# Patient Record
Sex: Male | Born: 1990 | Race: White | Hispanic: No | Marital: Married | Smoking: Current every day smoker
Health system: Southern US, Community
[De-identification: ages and names within clinical notes are randomized; demographics above are authoritative.]

## PROBLEM LIST (undated history)

## (undated) DIAGNOSIS — F329 Major depressive disorder, single episode, unspecified: Secondary | ICD-10-CM

## (undated) DIAGNOSIS — F32A Depression, unspecified: Secondary | ICD-10-CM

## (undated) DIAGNOSIS — T7840XA Allergy, unspecified, initial encounter: Secondary | ICD-10-CM

## (undated) DIAGNOSIS — F419 Anxiety disorder, unspecified: Secondary | ICD-10-CM

## (undated) HISTORY — DX: Anxiety disorder, unspecified: F41.9

## (undated) HISTORY — DX: Depression, unspecified: F32.A

## (undated) HISTORY — PX: TONSILLECTOMY: SUR1361

## (undated) HISTORY — DX: Allergy, unspecified, initial encounter: T78.40XA

---
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
## 2011-02-27 ENCOUNTER — Emergency Department (HOSPITAL_COMMUNITY)
Admission: EM | Admit: 2011-02-27 | Discharge: 2011-02-27 | Disposition: A | Discharge status: Home / Self Care | Payer: BC Managed Care – PPO | Attending: Emergency Medicine | Admitting: Emergency Medicine

## 2011-02-27 ENCOUNTER — Emergency Department (HOSPITAL_COMMUNITY): Payer: BC Managed Care – PPO

## 2011-02-27 DIAGNOSIS — S0230XA Fracture of orbital floor, unspecified side, initial encounter for closed fracture: Secondary | ICD-10-CM | POA: Insufficient documentation

## 2011-02-27 DIAGNOSIS — R1110 Vomiting, unspecified: Secondary | ICD-10-CM | POA: Insufficient documentation

## 2011-02-27 DIAGNOSIS — F17200 Nicotine dependence, unspecified, uncomplicated: Secondary | ICD-10-CM | POA: Insufficient documentation

## 2011-02-27 DIAGNOSIS — S0231XA Fracture of orbital floor, right side, initial encounter for closed fracture: Secondary | ICD-10-CM

## 2011-02-27 MED ORDER — CLINDAMYCIN HCL 150 MG PO CAPS
300.0000 mg | ORAL_CAPSULE | Freq: Once | ORAL | Status: AC
  Administered 2011-02-27: 300 mg via ORAL
  Filled 2011-02-27: qty 2

## 2011-02-27 MED ORDER — HYDROCODONE-ACETAMINOPHEN 5-500 MG PO TABS: 1.0000 | ORAL_TABLET | Freq: Four times a day (QID) | ORAL | Status: DC | PRN

## 2011-02-27 MED ORDER — CLINDAMYCIN HCL 150 MG PO CAPS: 300.0000 mg | ORAL_CAPSULE | Freq: Two times a day (BID) | ORAL | Status: AC

## 2011-02-27 MED ORDER — HYDROCODONE-ACETAMINOPHEN 5-500 MG PO TABS: 1.0000 | ORAL_TABLET | Freq: Four times a day (QID) | ORAL | Status: AC | PRN

## 2011-02-27 MED ORDER — IBUPROFEN 800 MG PO TABS: 800.0000 mg | ORAL_TABLET | Freq: Three times a day (TID) | ORAL | Status: DC

## 2011-02-27 MED ORDER — HYDROCODONE-ACETAMINOPHEN 5-325 MG PO TABS
2.0000 | ORAL_TABLET | Freq: Once | ORAL | Status: AC
  Administered 2011-02-27: 2 via ORAL
  Filled 2011-02-27: qty 2

## 2011-02-27 MED ORDER — IBUPROFEN 800 MG PO TABS
800.0000 mg | ORAL_TABLET | Freq: Once | ORAL | Status: AC
  Administered 2011-02-27: 800 mg via ORAL
  Filled 2011-02-27: qty 1

## 2011-02-27 MED ORDER — CLINDAMYCIN HCL 150 MG PO CAPS: 300.0000 mg | ORAL_CAPSULE | Freq: Two times a day (BID) | ORAL | Status: DC

## 2011-02-27 MED ORDER — IBUPROFEN 800 MG PO TABS: 800.0000 mg | ORAL_TABLET | Freq: Three times a day (TID) | ORAL | Status: AC
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
## 2011-02-27 NOTE — ED Notes (Signed)
Pt vomited green fld, says there was some blood in it , but not seen by me
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
## 2011-02-27 NOTE — ED Notes (Signed)
Ice pack applied.

## 2011-02-27 NOTE — ED Notes (Signed)
Pt out to desk, encouraged to stay for eval and tx

## 2011-02-27 NOTE — ED Notes (Signed)
Pt resting. Alert/oriented. Sitting up in bed. NAD.EDP in with pt and aware awaiting d/c papers.

## 2011-02-27 NOTE — Progress Notes (Signed)
0454 Spoke with Dr. Jearld Fenton, ENT. He advised patient could follow up in his office. Patient to call the office. Advised patient of consult and plan for follow up.
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
## 2012-09-29 ENCOUNTER — Encounter (HOSPITAL_COMMUNITY): Payer: Self-pay | Admitting: *Deleted

## 2012-09-29 ENCOUNTER — Emergency Department (HOSPITAL_COMMUNITY)
Admission: EM | Admit: 2012-09-29 | Discharge: 2012-09-29 | Disposition: A | Discharge status: Home / Self Care | Payer: BC Managed Care – PPO | Attending: Emergency Medicine | Admitting: Emergency Medicine

## 2012-09-29 DIAGNOSIS — R197 Diarrhea, unspecified: Secondary | ICD-10-CM | POA: Insufficient documentation

## 2012-09-29 DIAGNOSIS — IMO0001 Reserved for inherently not codable concepts without codable children: Secondary | ICD-10-CM | POA: Insufficient documentation

## 2012-09-29 DIAGNOSIS — R509 Fever, unspecified: Secondary | ICD-10-CM | POA: Insufficient documentation

## 2012-09-29 DIAGNOSIS — F17200 Nicotine dependence, unspecified, uncomplicated: Secondary | ICD-10-CM | POA: Insufficient documentation

## 2012-09-29 DIAGNOSIS — R109 Unspecified abdominal pain: Secondary | ICD-10-CM | POA: Insufficient documentation

## 2012-09-29 DIAGNOSIS — R51 Headache: Secondary | ICD-10-CM | POA: Insufficient documentation

## 2012-09-29 DIAGNOSIS — R112 Nausea with vomiting, unspecified: Secondary | ICD-10-CM | POA: Insufficient documentation

## 2012-09-29 LAB — CBC WITH DIFFERENTIAL/PLATELET
Basophils Absolute: 0 10*3/uL (ref 0.0–0.1)
Basophils Relative: 0 % (ref 0–1)
Eosinophils Absolute: 0 10*3/uL (ref 0.0–0.7)
MCH: 30.7 pg (ref 26.0–34.0)
MCHC: 36.4 g/dL — ABNORMAL HIGH (ref 30.0–36.0)
Neutrophils Relative %: 92 % — ABNORMAL HIGH (ref 43–77)
Platelets: 236 10*3/uL (ref 150–400)
RDW: 12.7 % (ref 11.5–15.5)

## 2012-09-29 LAB — COMPREHENSIVE METABOLIC PANEL
ALT: 30 U/L (ref 0–53)
AST: 19 U/L (ref 0–37)
Albumin: 5 g/dL (ref 3.5–5.2)
Alkaline Phosphatase: 110 U/L (ref 39–117)
BUN: 18 mg/dL (ref 6–23)
Potassium: 4 mEq/L (ref 3.5–5.1)
Sodium: 141 mEq/L (ref 135–145)
Total Protein: 8 g/dL (ref 6.0–8.3)

## 2012-09-29 MED ORDER — ONDANSETRON 4 MG PO TBDP
8.0000 mg | ORAL_TABLET | Freq: Once | ORAL | Status: AC
  Administered 2012-09-29: 8 mg via ORAL

## 2012-09-29 MED ORDER — SODIUM CHLORIDE 0.9 % IV BOLUS (SEPSIS)
1000.0000 mL | Freq: Once | INTRAVENOUS | Status: AC
  Administered 2012-09-29: 1000 mL via INTRAVENOUS

## 2012-09-29 MED ORDER — ONDANSETRON 4 MG PO TBDP
ORAL_TABLET | ORAL | Status: AC
  Filled 2012-09-29: qty 2
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
## 2012-09-29 NOTE — ED Notes (Signed)
Second 1L bolus of NS started per Dondra Spry NP verbal order

## 2012-09-29 NOTE — ED Notes (Signed)
Pt with acute onset emesis, diarrhea and lower abd pain that radiates to bil flank while at work this am.  Keith Ross was sick with same s/s earlier, but s/s were less.

## 2012-09-30 NOTE — ED Provider Notes (Signed)
Medical screening examination/treatment/procedure(s) were performed by non-physician practitioner and as supervising physician I was immediately available for consultation/collaboration.   Adynn Caseres H Jakeim Sedore, MD 09/30/12 2144 

## 2015-06-30 ENCOUNTER — Ambulatory Visit (INDEPENDENT_AMBULATORY_CARE_PROVIDER_SITE_OTHER): Payer: Worker's Compensation

## 2015-06-30 ENCOUNTER — Ambulatory Visit
Admission: EM | Admit: 2015-06-30 | Discharge: 2015-06-30 | Disposition: A | Discharge status: Home / Self Care | Payer: Worker's Compensation | Attending: Family Medicine | Admitting: Family Medicine

## 2015-06-30 ENCOUNTER — Encounter: Payer: Self-pay | Admitting: Emergency Medicine

## 2015-06-30 DIAGNOSIS — T148 Other injury of unspecified body region: Secondary | ICD-10-CM | POA: Diagnosis not present

## 2015-06-30 DIAGNOSIS — S62639B Displaced fracture of distal phalanx of unspecified finger, initial encounter for open fracture: Secondary | ICD-10-CM | POA: Diagnosis not present

## 2015-06-30 DIAGNOSIS — T148XXA Other injury of unspecified body region, initial encounter: Secondary | ICD-10-CM

## 2015-06-30 MED ORDER — IBUPROFEN 800 MG PO TABS
800.0000 mg | ORAL_TABLET | Freq: Once | ORAL | Status: AC
  Administered 2015-06-30: 800 mg via ORAL

## 2015-06-30 MED ORDER — SULFAMETHOXAZOLE-TRIMETHOPRIM 800-160 MG PO TABS: 1.0000 | ORAL_TABLET | Freq: Two times a day (BID) | ORAL | Status: DC

## 2015-06-30 MED ORDER — IBUPROFEN 800 MG PO TABS: 800.0000 mg | ORAL_TABLET | Freq: Once | ORAL | Status: DC

## 2015-06-30 MED ORDER — TETANUS-DIPHTH-ACELL PERTUSSIS 5-2.5-18.5 LF-MCG/0.5 IM SUSP
0.5000 mL | Freq: Once | INTRAMUSCULAR | Status: AC
  Administered 2015-06-30: 0.5 mL via INTRAMUSCULAR

## 2015-06-30 MED ORDER — ACETAMINOPHEN 500 MG PO TABS: 1000.0000 mg | ORAL_TABLET | Freq: Four times a day (QID) | ORAL | Status: AC | PRN

## 2015-06-30 MED ORDER — MUPIROCIN 2 % EX OINT: 1.0000 "application " | TOPICAL_OINTMENT | Freq: Two times a day (BID) | CUTANEOUS | Status: DC

## 2015-06-30 NOTE — Discharge Instructions (Signed)
Cast or Splint Care °Casts and splints support injured limbs and keep bones from moving while they heal. It is important to care for your cast or splint at home.   °HOME CARE INSTRUCTIONS °· Keep the cast or splint uncovered during the drying period. It can take 24 to 48 hours to dry if it is made of plaster. A fiberglass cast will dry in less than 1 hour. °· Do not rest the cast on anything harder than a pillow for the first 24 hours. °· Do not put weight on your injured limb or apply pressure to the cast until your health care provider gives you permission. °· Keep the cast or splint dry. Wet casts or splints can lose their shape and may not support the limb as well. A wet cast that has lost its shape can also create harmful pressure on your skin when it dries. Also, wet skin can become infected. °· Cover the cast or splint with a plastic bag when bathing or when out in the rain or snow. If the cast is on the trunk of the body, take sponge baths until the cast is removed. °· If your cast does become wet, dry it with a towel or a blow dryer on the cool setting only. °· Keep your cast or splint clean. Soiled casts may be wiped with a moistened cloth. °· Do not place any hard or soft foreign objects under your cast or splint, such as cotton, toilet paper, lotion, or powder. °· Do not try to scratch the skin under the cast with any object. The object could get stuck inside the cast. Also, scratching could lead to an infection. If itching is a problem, use a blow dryer on a cool setting to relieve discomfort. °· Do not trim or cut your cast or remove padding from inside of it. °· Exercise all joints next to the injury that are not immobilized by the cast or splint. For example, if you have a long leg cast, exercise the hip joint and toes. If you have an arm cast or splint, exercise the shoulder, elbow, thumb, and fingers. °· Elevate your injured arm or leg on 1 or 2 pillows for the first 1 to 3 days to decrease  swelling and pain. It is best if you can comfortably elevate your cast so it is higher than your heart. °SEEK MEDICAL CARE IF:  °· Your cast or splint cracks. °· Your cast or splint is too tight or too loose. °· You have unbearable itching inside the cast. °· Your cast becomes wet or develops a soft spot or area. °· You have a bad smell coming from inside your cast. °· You get an object stuck under your cast. °· Your skin around the cast becomes red or raw. °· You have new pain or worsening pain after the cast has been applied. °SEEK IMMEDIATE MEDICAL CARE IF:  °· You have fluid leaking through the cast. °· You are unable to move your fingers or toes. °· You have discolored (blue or white), cool, painful, or very swollen fingers or toes beyond the cast. °· You have tingling or numbness around the injured area. °· You have severe pain or pressure under the cast. °· You have any difficulty with your breathing or have shortness of breath. °· You have chest pain. °  °This information is not intended to replace advice given to you by your health care provider. Make sure you discuss any questions you have with your health care   provider.   Document Released: 08/02/2000 Document Revised: 05/26/2013 Document Reviewed: 02/11/2013 Elsevier Interactive Patient Education 2016 [employer]  Finger Fracture Fractures of fingers are breaks in the bones of the fingers. There are many types of fractures. There are different ways of treating these fractures. Your health care provider will discuss the best way to treat your fracture. CAUSES Traumatic injury is the main cause of broken fingers. These include:  Injuries while playing sports.  Workplace injuries.  Falls. RISK FACTORS Activities that can increase your risk of finger fractures include:  Sports.  Workplace activities that involve machinery.  A condition called osteoporosis, which can make your bones less dense and cause them to fracture more  easily. SIGNS AND SYMPTOMS The main symptoms of a broken finger are pain and swelling within 15 minutes after the injury. Other symptoms include:  Bruising of your finger.  Stiffness of your finger.  Numbness of your finger.  Exposed bones (compound fracture) if the fracture is severe. DIAGNOSIS  The best way to diagnose a broken bone is with X-ray imaging. Additionally, your health care provider will use this X-ray image to evaluate the position of the broken finger bones.  TREATMENT  Finger fractures can be treated with:   Nonreduction--This means the bones are in place. The finger is splinted without changing the positions of the bone pieces. The splint is usually left on for about a week to 10 days. This will depend on your fracture and what your health care provider thinks.  Closed reduction--The bones are put back into position without using surgery. The finger is then splinted.  Open reduction and internal fixation--The fracture site is opened. Then the bone pieces are fixed into place with pins or some type of hardware. This is seldom required. It depends on the severity of the fracture. HOME CARE INSTRUCTIONS   Follow your health care provider's instructions regarding activities, exercises, and physical therapy.  Only take over-the-counter or prescription medicines for pain, discomfort, or fever as directed by your health care provider. SEEK MEDICAL CARE IF: You have pain or swelling that limits the motion or use of your fingers. SEEK IMMEDIATE MEDICAL CARE IF:  Your finger becomes numb. MAKE SURE YOU:   Understand these instructions.  Will watch your condition.  Will get help right away if you are not doing well or get worse.   This information is not intended to replace advice given to you by your health care provider. Make sure you discuss any questions you have with your health care provider.   Document Released: 11/17/2000 Document Revised: 05/26/2013 Document  Reviewed: 03/17/2013 Elsevier Interactive Patient Education 2016 [employer]. Abrasion An abrasion is a cut or scrape on the outer surface of your skin. An abrasion does not extend through all of the layers of your skin. It is important to care for your abrasion properly to prevent infection. CAUSES Most abrasions are caused by falling on or gliding across the ground or another surface. When your skin rubs on something, the outer and inner layer of skin rubs off.  SYMPTOMS A cut or scrape is the main symptom of this condition. The scrape may be bleeding, or it may appear red or pink. If there was an associated fall, there may be an underlying bruise. DIAGNOSIS An abrasion is diagnosed with a physical exam. TREATMENT Treatment for this condition depends on how large and deep the abrasion is. Usually, your abrasion will be cleaned with water and mild soap. This removes any  dirt or debris that may be stuck. An antibiotic ointment may be applied to the abrasion to help prevent infection. A bandage (dressing) may be placed on the abrasion to keep it clean. You may also need a tetanus shot. HOME CARE INSTRUCTIONS Medicines  Take or apply medicines only as directed by your health care provider.  If you were prescribed an antibiotic ointment, finish all of it even if you start to feel better. Wound Care  Clean the wound with mild soap and water 2-3 times per day or as directed by your health care provider. Pat your wound dry with a clean towel. Do not rub it.  There are many different ways to close and cover a wound. Follow instructions from your health care provider about:  Wound care.  Dressing changes and removal.  Check your wound every day for signs of infection. Watch for:  Redness, swelling, or pain.  Fluid, blood, or pus. General Instructions  Keep the dressing dry as directed by your health care provider. Do not take baths, swim, use a hot tub, or do anything that would put  your wound underwater until your health care provider approves.  If there is swelling, raise (elevate) the injured area above the level of your heart while you are sitting or lying down.  Keep all follow-up visits as directed by your health care provider. This is important. SEEK MEDICAL CARE IF:  You received a tetanus shot and you have swelling, severe pain, redness, or bleeding at the injection site.  Your pain is not controlled with medicine.  You have increased redness, swelling, or pain at the site of your wound. SEEK IMMEDIATE MEDICAL CARE IF:  You have a red streak going away from your wound.  You have a fever.  You have fluid, blood, or pus coming from your wound.  You notice a bad smell coming from your wound or your dressing.   This information is not intended to replace advice given to you by your health care provider. Make sure you discuss any questions you have with your health care provider.   Document Released: 05/15/2005 Document Revised: 04/26/2015 Document Reviewed: 08/03/2014 Elsevier Interactive Patient Education [employer].
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
## 2015-07-01 NOTE — ED Provider Notes (Signed)
CSN: [id number]     Arrival date & time 06/30/15  1522 History   First MD Initiated Contact with Patient 06/30/15 1601     Chief Complaint  Patient presents with  . Worker's Comp Injury   . Hand Pain   (Consider location/radiation/quality/duration/timing/severity/associated sxs/prior Treatment) HPI Comments: Single caucasian male here for evaluation left hand/finger pain after truck tire smashed it on tire bolts (pinched) when he was trying to reinstall it at work worst pain left 4th finger which is also bleeding, swollen and bruised.  Pain with movement of fingers.  Works for [employer] in [place]  Right hand dominant  Pinky finger is the most sore.  Changing jobs/companies next week tomorrow last day with [employer]RS.  The history is provided by the patient.    History reviewed. No pertinent past medical history. Past Surgical History  Procedure Laterality Date  . Tonsillectomy     History reviewed. No pertinent family history. Social History  Substance Use Topics  . Smoking status: Current Every Day Smoker -- 0.04 packs/day    Types: Cigarettes  . Smokeless tobacco: None  . Alcohol Use: No    Review of Systems  Constitutional: Negative for fever, chills, diaphoresis, activity change, appetite change, fatigue and unexpected weight change.  HENT: Negative for congestion, dental problem, drooling, ear discharge, ear pain, facial swelling, hearing loss, mouth sores and nosebleeds.   Eyes: Negative for photophobia, pain, discharge, redness, itching and visual disturbance.  Respiratory: Negative for cough, choking, chest tightness, shortness of breath, wheezing and stridor.   Cardiovascular: Negative for chest pain, palpitations and leg swelling.  Gastrointestinal: Negative for nausea, vomiting, abdominal pain, diarrhea, constipation and abdominal distention.  Endocrine: Negative for cold intolerance and heat intolerance.  Genitourinary: Negative for difficulty urinating.  Musculoskeletal:  Positive for myalgias, joint swelling and arthralgias. Negative for back pain, gait problem, neck pain and neck stiffness.  Skin: Positive for color change and wound. Negative for pallor and rash.  Allergic/Immunologic: Negative for environmental allergies and food allergies.  Neurological: Negative for dizziness, tremors, seizures, syncope, facial asymmetry, speech difficulty, weakness, light-headedness, numbness and headaches.  Hematological: Negative for adenopathy. Does not bruise/bleed easily.  Psychiatric/Behavioral: Negative for behavioral problems, confusion, sleep disturbance and agitation.    Allergies  Review of patient's allergies indicates no known allergies.  Home Medications   Prior to Admission medications   Medication Sig Start Date End Date Taking? Authorizing Provider  acetaminophen (TYLENOL) 500 MG tablet Take 2 tablets (1,000 mg total) by mouth every 6 (six) hours as needed for mild pain or moderate pain. 06/30/15 07/28/15  Olen Cordial, NP  ibuprofen (ADVIL,MOTRIN) 800 MG tablet Take 1 tablet (800 mg total) by mouth once. 06/30/15   Olen Cordial, NP  mupirocin ointment (BACTROBAN) 2 % Apply 1 application topically 2 (two) times daily. Left 4th finger x 10 days 06/30/15   Olen Cordial, NP  sulfamethoxazole-trimethoprim (BACTRIM DS,SEPTRA DS) 800-160 MG tablet Take 1 tablet by mouth 2 (two) times daily. 06/30/15   Olen Cordial, NP   Meds Ordered and Administered this Visit   Medications  Tdap (BOOSTRIX) injection 0.5 mL (0.5 mLs Intramuscular Given 06/30/15 1557)  ibuprofen (ADVIL,MOTRIN) tablet 800 mg (800 mg Oral Given 06/30/15 1609)    BP 138/70 mmHg  Pulse 86  Temp(Src) 98.2 F (36.8 C) (Tympanic)  Resp 16  Ht 6' (1.829 m)  Wt 240 lb (108.863 kg)  BMI 32.54 kg/m2  SpO2 100% No data found.   Physical  Exam  Constitutional: He is oriented to person, place, and time. Vital signs are normal. He appears well-developed and well-nourished. He  is active and cooperative.  Non-toxic appearance. He does not have a sickly appearance. He does not appear ill. No distress.  HENT:  Head: Normocephalic and atraumatic.  Right Ear: External ear normal.  Left Ear: External ear normal.  Nose: Nose normal.  Mouth/Throat: Oropharynx is clear and moist. No oropharyngeal exudate.  Eyes: Conjunctivae, EOM and lids are normal. Pupils are equal, round, and reactive to light. Right eye exhibits no discharge. Left eye exhibits no discharge. No scleral icterus.  Neck: Normal range of motion. Neck supple. No tracheal tenderness, no spinous process tenderness and no muscular tenderness present. No rigidity. No tracheal deviation, no edema, no erythema and normal range of motion present. No thyromegaly present.  Cardiovascular: Normal rate, regular rhythm, S1 normal, S2 normal, normal heart sounds and intact distal pulses.  Exam reveals no gallop and no friction rub.   No murmur heard. Pulses:      Radial pulses are 2+ on the right side, and 2+ on the left side.  Pulmonary/Chest: Effort normal. No accessory muscle usage or stridor. No respiratory distress. He has no decreased breath sounds. He has no wheezes. He has no rhonchi. He has no rales.  Abdominal: Soft. He exhibits no distension.  Musculoskeletal: He exhibits edema and tenderness.       Right shoulder: Normal.       Left shoulder: Normal.       Right elbow: Normal.      Left elbow: Normal.       Right wrist: Normal.       Left wrist: Normal.       Right hip: Normal.       Left hip: Normal.       Right knee: Normal.       Left knee: Normal.       Cervical back: Normal.       Thoracic back: Normal.       Lumbar back: Normal.       Right upper arm: Normal.       Left upper arm: Normal.       Right forearm: Normal.       Left forearm: Normal.       Right hand: Normal.       Left hand: He exhibits decreased range of motion, tenderness, bony tenderness and swelling. He exhibits normal capillary  refill, no deformity and no laceration. Normal sensation noted. Normal strength noted.  Lymphadenopathy:    He has no cervical adenopathy.  Neurological: He is alert and oriented to person, place, and time. He is not disoriented. He displays no atrophy and no tremor. No cranial nerve deficit or sensory deficit. He exhibits normal muscle tone. He displays no seizure activity. Coordination and gait normal. GCS eye subscore is 4. GCS verbal subscore is 5. GCS motor subscore is 6.  Skin: Skin is warm. Abrasion, bruising and ecchymosis noted. No burn, no laceration, no lesion, no petechiae and no rash noted. He is not diaphoretic. No cyanosis or erythema. No pallor. Nails show no clubbing.     Bruised volar surface distal left 4th phalange; nail fold bleeding dorsal surface distal 4th phalange 1+ edema; MTP 5th/phalanges TTP  And 3rd phalanges left  Psychiatric: He has a normal mood and affect. His speech is normal and behavior is normal. Judgment and thought content normal. Cognition and memory are normal.  Nursing note and vitals reviewed.   ED Course  Procedures (including critical care time)  Labs Review Labs Reviewed - No data to display  Imaging Review Dg Hand Complete Left  06/30/2015  CLINICAL DATA:  Injury while changing tire.  Laceration fourth digit EXAM: LEFT HAND - COMPLETE 3+ VIEW COMPARISON:  None. FINDINGS: Frontal, oblique, and lateral views were obtained. There is a small avulsion arising from the volar aspect of the distal portion of the fourth distal phalanx. No other fracture. No dislocation. Joint spaces appear intact. No erosive change. No radiopaque foreign body. IMPRESSION: Avulsion along the volar aspect of the distal portion of the fourth distal phalanx. Alignment near anatomic. No other fracture. No dislocation. No radiopaque foreign body. No arthropathy. These results will be called to the ordering clinician or representative by the Radiologist Assistant, and  communication documented in the PACS or zVision Dashboard. Electronically Signed   By: Lowella Grip III M.D.   On: 06/30/2015 16:25    1700 discussed xray results with patient  Given copy of radiology report.  Workers Health and safety inspector paperwork completed and given to patient.  Crush injury, abrasion, avulsion fracture distal left 5th phalange volar aspect.  splint applied by RN Tula Nakayama after hibiclens scrub, ice water bath, triple antibiotic and simple dressing applied.  Discussed with patient to take orthopedics Rx to HR dept for scheduling follow up appt next week recommended appt in 1 week as treating injury as open fracture.  Started on bactrim DS po BID x 7 days as open avulsion fracture.  Typically 6 weeks in finger splint discussed with patient.  Elevate, ice, bactroban topical twice a day with dressing changews prn contamination/saturation, tylenol 1000mg  po QID and/or motrin 800mg  po TID prn pain cryotherapy TID  MDM   1. Abrasion   2. Avulsion fracture of distal phalanx of finger, open, initial encounter   3. Contusion    Patient was instructed to rest, ice and elevate left hand.  Do not soak hand until abrasions healed avoid pool, lake, hot tub, dirty sink water.  Exitcare handout on contusion, laceration, hand pain given to patient.   Medications as directed.  Call or return to clinic as needed if these symptoms worsen or fail to improve as anticipated e.g. Blue cold fingers, worsening pain, erythema, purulent drainage  Avoid lifting/pushing/pulling greater than 10 lbs x 1 week left hand, .  Tetanus booster administered in clinic as metal caused abrasion..  Patient verbalized agreement and understanding of treatment plan.  P2:  ROM, injury prevention   Olen Cordial, NP 07/01/15 1816

## 2015-07-14 ENCOUNTER — Ambulatory Visit (INDEPENDENT_AMBULATORY_CARE_PROVIDER_SITE_OTHER): Payer: BLUE CROSS/BLUE SHIELD | Admitting: Family Medicine

## 2015-07-14 ENCOUNTER — Encounter: Payer: Self-pay | Admitting: Family Medicine

## 2015-07-14 VITALS — HR 95 | Temp 98.9°F | Ht 72.0 in | Wt 241.2 lb

## 2015-07-14 DIAGNOSIS — A084 Viral intestinal infection, unspecified: Secondary | ICD-10-CM

## 2015-07-14 MED ORDER — ONDANSETRON 4 MG PO TBDP: 4.0000 mg | ORAL_TABLET | Freq: Three times a day (TID) | ORAL | Status: DC | PRN

## 2015-07-14 NOTE — Progress Notes (Signed)
Pulse 95  Temp(Src) 98.9 F (37.2 C) (Oral)  Ht 6' (1.829 m)  Wt 241 lb 3.2 oz (109.408 kg)  BMI 32.71 kg/m2   Subjective:    Patient ID: Keith Ross, male    DOB: Jun 09, 1991, 24 y.o.   MRN: [id number]  HPI: Keith Ross is a 24 y.o. male presenting on 07/14/2015 for Abdominal Pain; Diarrhea; and Vomiting   HPI Diarrhea and vomiting Patient has been having vomiting and diarrhea since yesterday at 8 PM. He denies any fevers or chills. He denies any blood in his vomit or in his diarrhea. He has been trying to stay hydrated with Pedialyte. He has had 4 episodes of vomiting since yesterday but none yet today and about 5 episodes of diarrhea overnight but only 1 today. He also has epigastric abdominal pain that does not radiate anywhere else. It is low-grade and cramping sensation and bloating sensation. No one else is sick in his family.  Relevant past medical, surgical, family and social history reviewed and updated as indicated. Interim medical history since our last visit reviewed. Allergies and medications reviewed and updated.  Review of Systems  Constitutional: Negative for fever.  HENT: Negative for ear discharge and ear pain.   Eyes: Negative for discharge and visual disturbance.  Respiratory: Negative for shortness of breath and wheezing.   Cardiovascular: Negative for chest pain and leg swelling.  Gastrointestinal: Positive for nausea, vomiting, abdominal pain and diarrhea. Negative for constipation, blood in stool, abdominal distention and anal bleeding.  Genitourinary: Negative for difficulty urinating.  Musculoskeletal: Negative for back pain and gait problem.  Skin: Negative for rash.  Neurological: Negative for syncope, light-headedness and headaches.  All other systems reviewed and are negative.   Per HPI unless specifically indicated above     Medication List       This list is accurate as of: 07/14/15  3:10 PM.  Always use your most recent med list.               acetaminophen 500 MG tablet  Commonly known as:  TYLENOL  Take 2 tablets (1,000 mg total) by mouth every 6 (six) hours as needed for mild pain or moderate pain.     ibuprofen 800 MG tablet  Commonly known as:  ADVIL,MOTRIN  Take 1 tablet (800 mg total) by mouth once.     mupirocin ointment 2 %  Commonly known as:  BACTROBAN  Apply 1 application topically 2 (two) times daily. Left 4th finger x 10 days     ondansetron 4 MG disintegrating tablet  Commonly known as:  ZOFRAN-ODT  Take 1 tablet (4 mg total) by mouth every 8 (eight) hours as needed for nausea or vomiting.     sulfamethoxazole-trimethoprim 800-160 MG tablet  Commonly known as:  BACTRIM DS,SEPTRA DS  Take 1 tablet by mouth 2 (two) times daily.           Objective:    Pulse 95  Temp(Src) 98.9 F (37.2 C) (Oral)  Ht 6' (1.829 m)  Wt 241 lb 3.2 oz (109.408 kg)  BMI 32.71 kg/m2  Wt Readings from Last 3 Encounters:  07/14/15 241 lb 3.2 oz (109.408 kg)  06/30/15 240 lb (108.863 kg)  02/27/11 215 lb (97.523 kg) (96 %*, Z = 1.78)   * Growth percentiles are based on CDC 2-20 Years data.    Physical Exam  Constitutional: He is oriented to person, place, and time. He appears well-developed and well-nourished. No distress.  Eyes: Conjunctivae and EOM are normal. Right eye exhibits no discharge. No scleral icterus.  Cardiovascular: Normal rate, regular rhythm, normal heart sounds and intact distal pulses.   No murmur heard. Pulmonary/Chest: Effort normal and breath sounds normal. No respiratory distress. He has no wheezes.  Abdominal: Soft. Bowel sounds are normal. He exhibits no distension. There is tenderness (epigastric, but no CVA tenderness). There is no rebound and no guarding.  Musculoskeletal: Normal range of motion. He exhibits no edema.  Neurological: He is alert and oriented to person, place, and time. Coordination normal.  Skin: Skin is warm and dry. No rash noted. He is not diaphoretic.   Psychiatric: He has a normal mood and affect. His behavior is normal.  Vitals reviewed.     Assessment & Plan:   Problem List Items Addressed This Visit    None    Visit Diagnoses    Viral gastroenteritis    -  Primary    Relevant Medications    ondansetron (ZOFRAN-ODT) 4 MG disintegrating tablet        Follow up plan: Return if symptoms worsen or fail to improve.  Counseling provided for all of the vaccine components No orders of the defined types were placed in this encounter.    Caryl Pina, MD [facility] 07/14/2015, 3:10 PM

## 2015-08-19 ENCOUNTER — Ambulatory Visit
Admission: EM | Admit: 2015-08-19 | Discharge: 2015-08-19 | Disposition: A | Discharge status: Home / Self Care | Payer: Worker's Compensation | Attending: Family Medicine | Admitting: Family Medicine

## 2015-08-19 ENCOUNTER — Encounter: Payer: Self-pay | Admitting: Emergency Medicine

## 2015-08-19 DIAGNOSIS — S6992XD Unspecified injury of left wrist, hand and finger(s), subsequent encounter: Secondary | ICD-10-CM

## 2015-08-19 NOTE — ED Notes (Addendum)
Workers Comp follow up finger injury. No new symptoms

## 2015-08-19 NOTE — ED Provider Notes (Signed)
Patient presents today for release to full work duties. He states that he has no problems with range of motion or function of his digits of his left hand. He denies any pain or tingling or numbness of the digits. Patient was seen here and treated for Workmen's Comp. injury to his left fourth digit.  Review systems negative except mentioned above. Vitals as per Epic.  GENERAL: NAD MSK: L Hand- no obvious deformity, FROM, no tenderness, normal grip, normal ability to make fist, nv intact  NEURO: CN II-XII grossly intact   A/P: L Fourth Digit Injury- resolved, has no pain, full range of motion and strength, form given to patient to be released to full duties at work. Seek medical attention if any further problems.    Paulina Fusi, MD 08/19/15 [phone]

## 2016-05-18 IMAGING — CR DG HAND COMPLETE 3+V*L*
3 series · 3 of 3 positions shown · non-contrast
Comparison: None.

CLINICAL DATA: Injury while changing tire.  Laceration fourth digit

EXAM:
LEFT HAND - COMPLETE 3+ VIEW

[hand ap]
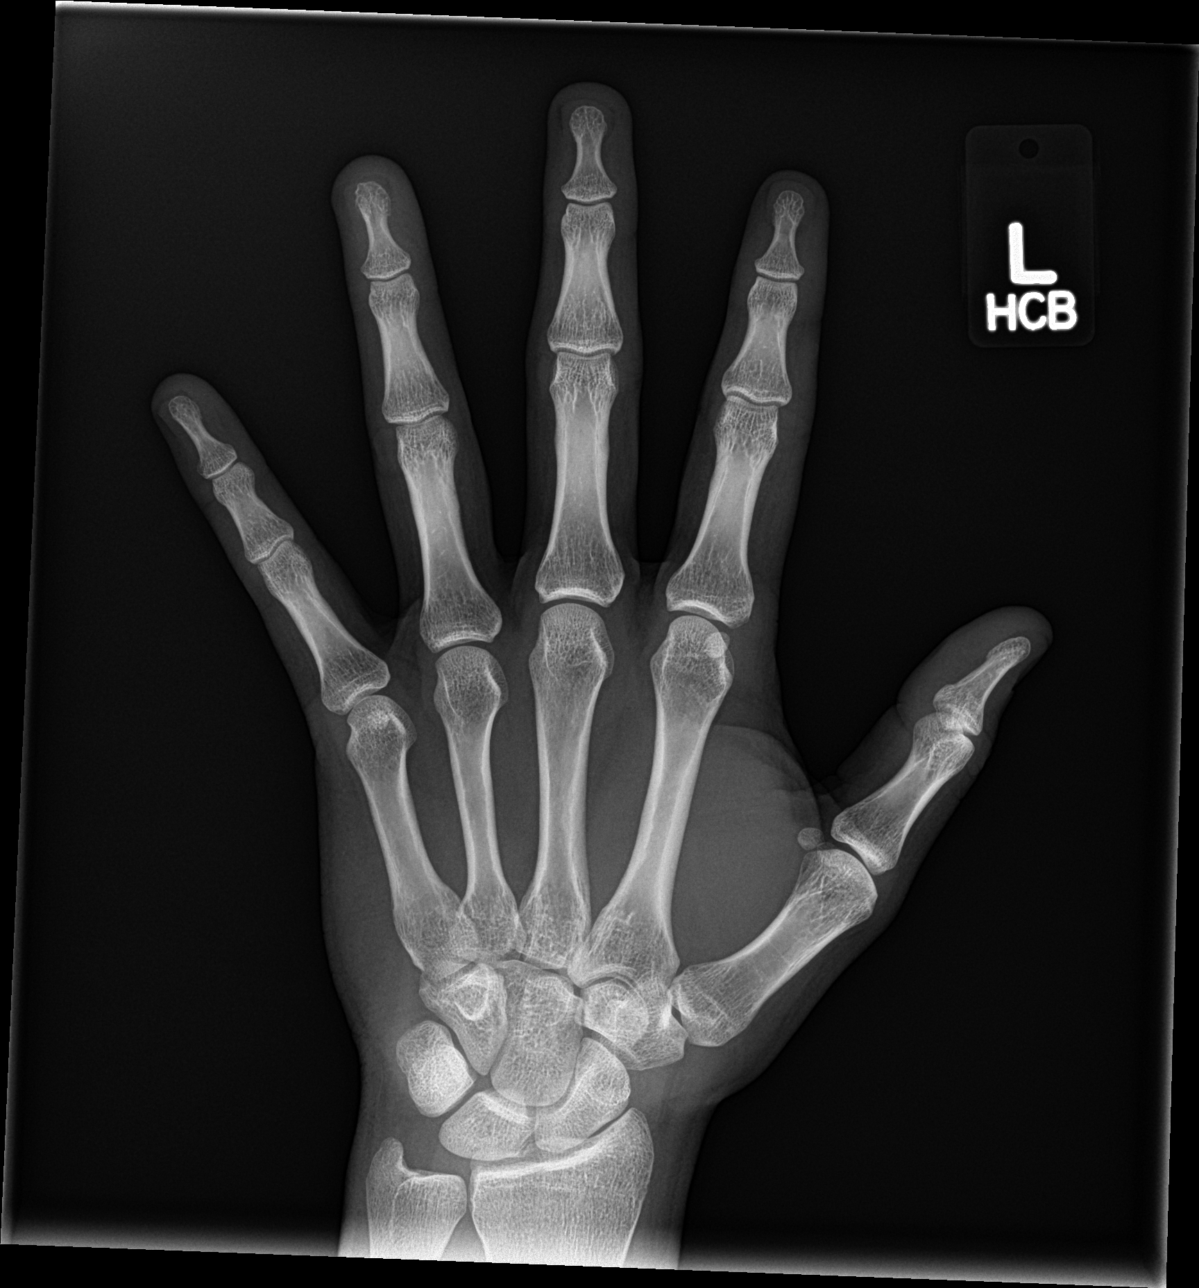

[hand obl]
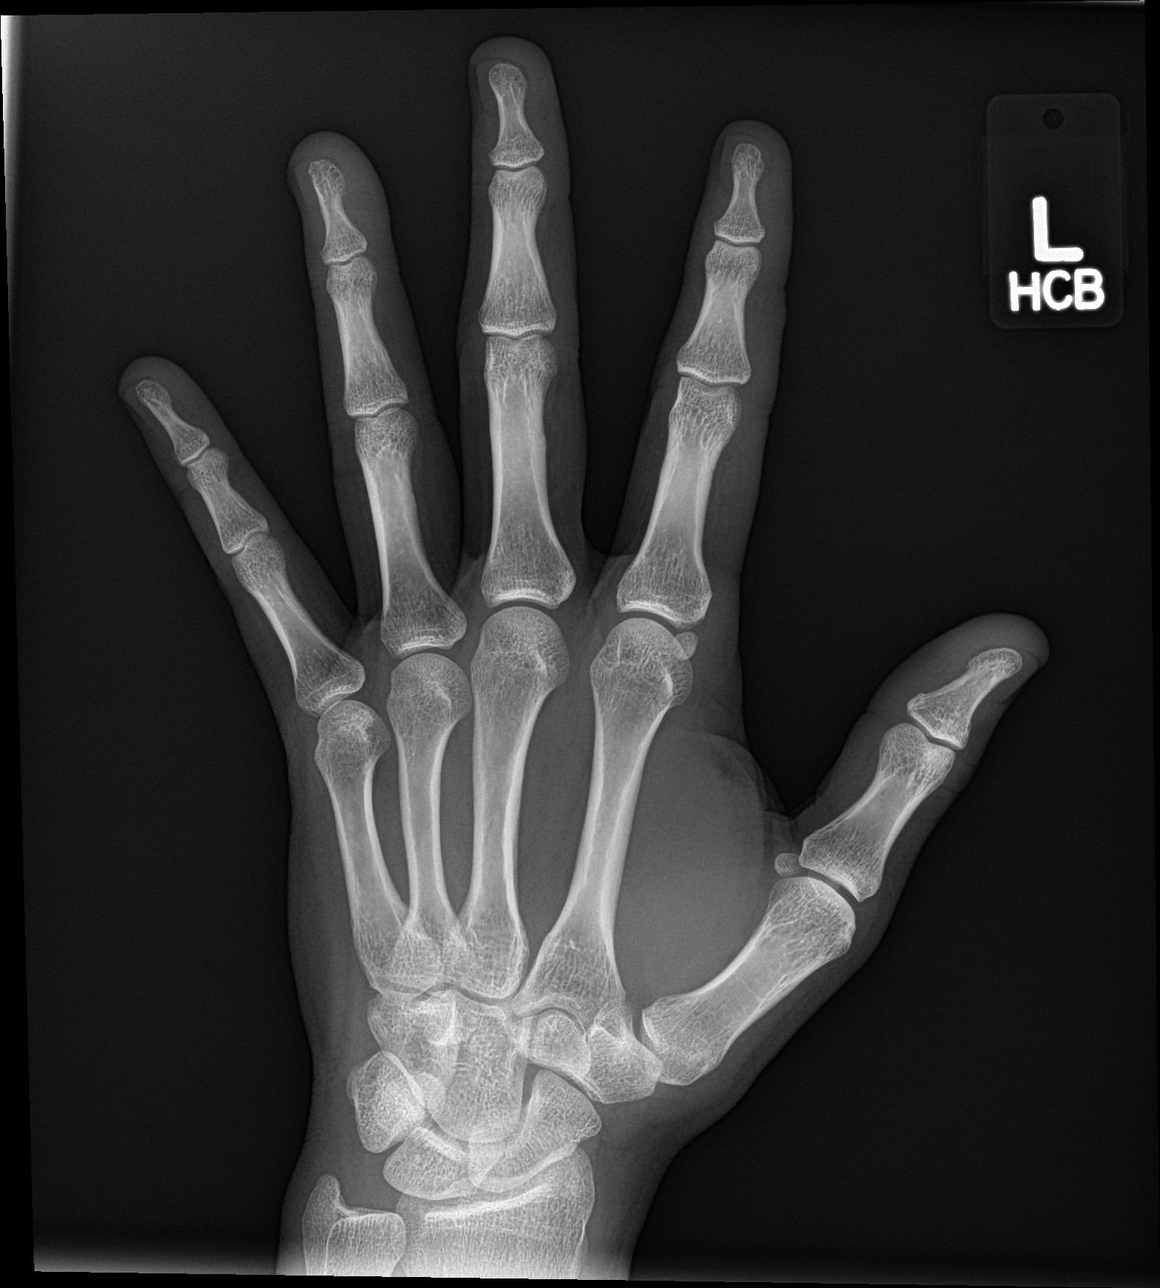

[hand lat]
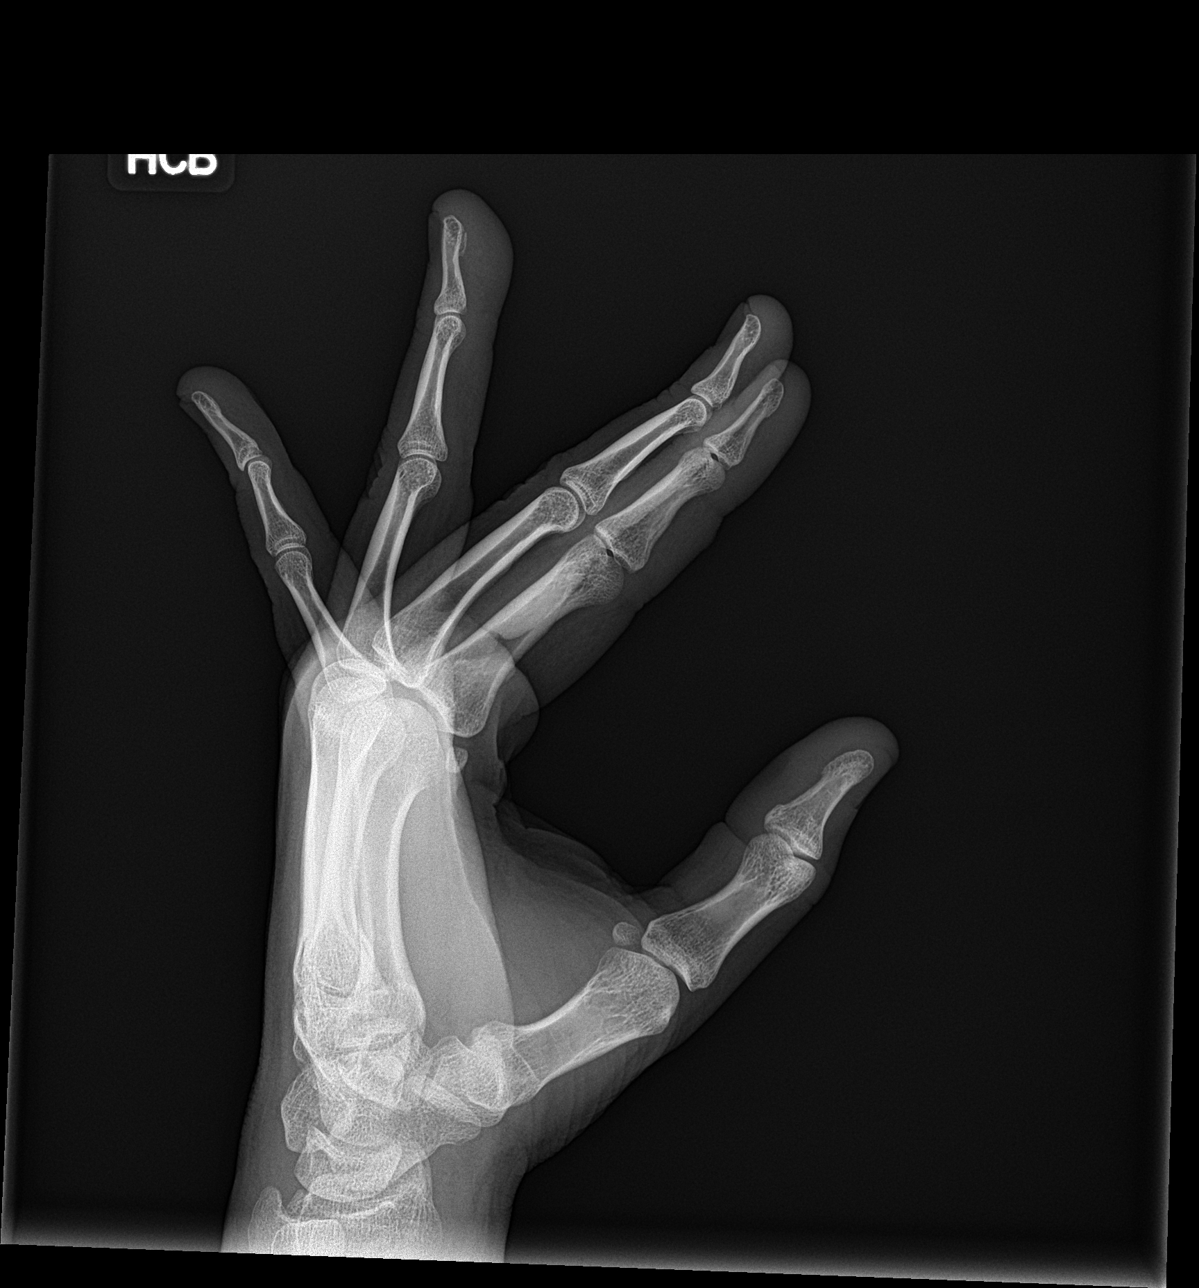

[3 of 3 positions shown; findings below may reference images not displayed]

FINDINGS: Frontal, oblique, and lateral views were obtained. There is a small
avulsion arising from the volar aspect of the distal portion of the
fourth distal phalanx. No other fracture. No dislocation. Joint
spaces appear intact. No erosive change. No radiopaque foreign body.
IMPRESSION: Avulsion along the volar aspect of the distal portion of the fourth
distal phalanx. Alignment near anatomic. No other fracture. No
dislocation. No radiopaque foreign body. No arthropathy.

These results will be called to the ordering clinician or
representative by the Radiologist Assistant, and communication
documented in the PACS or zVision Dashboard.

## 2017-09-15 ENCOUNTER — Telehealth: Payer: Self-pay | Admitting: Pediatrics

## 2017-09-15 DIAGNOSIS — N469 Male infertility, unspecified: Secondary | ICD-10-CM

## 2017-09-15 NOTE — Telephone Encounter (Signed)
Please advise 

## 2017-09-15 NOTE — Telephone Encounter (Signed)
Patient notified that referral has been made. Patient verbalized understanding

## 2017-11-28 ENCOUNTER — Ambulatory Visit: Payer: 59 | Admitting: Urology

## 2017-11-28 DIAGNOSIS — N468 Other male infertility: Secondary | ICD-10-CM

## 2019-06-16 DIAGNOSIS — M5440 Lumbago with sciatica, unspecified side: Secondary | ICD-10-CM | POA: Diagnosis not present

## 2019-06-16 DIAGNOSIS — S233XXA Sprain of ligaments of thoracic spine, initial encounter: Secondary | ICD-10-CM | POA: Diagnosis not present

## 2019-06-16 DIAGNOSIS — S134XXA Sprain of ligaments of cervical spine, initial encounter: Secondary | ICD-10-CM | POA: Diagnosis not present

## 2019-07-14 DIAGNOSIS — S134XXA Sprain of ligaments of cervical spine, initial encounter: Secondary | ICD-10-CM | POA: Diagnosis not present

## 2019-07-14 DIAGNOSIS — S233XXA Sprain of ligaments of thoracic spine, initial encounter: Secondary | ICD-10-CM | POA: Diagnosis not present

## 2019-07-14 DIAGNOSIS — M5440 Lumbago with sciatica, unspecified side: Secondary | ICD-10-CM | POA: Diagnosis not present

## 2019-07-28 ENCOUNTER — Other Ambulatory Visit: Payer: Self-pay

## 2019-07-29 ENCOUNTER — Ambulatory Visit (INDEPENDENT_AMBULATORY_CARE_PROVIDER_SITE_OTHER): Payer: BC Managed Care – PPO | Admitting: Family Medicine

## 2019-07-29 ENCOUNTER — Encounter: Payer: Self-pay | Admitting: Family Medicine

## 2019-07-29 VITALS — BP 125/80 | HR 90 | Temp 99.0°F | Resp 20 | Ht 72.0 in | Wt 253.0 lb

## 2019-07-29 DIAGNOSIS — F339 Major depressive disorder, recurrent, unspecified: Secondary | ICD-10-CM | POA: Diagnosis not present

## 2019-07-29 DIAGNOSIS — F411 Generalized anxiety disorder: Secondary | ICD-10-CM

## 2019-07-29 DIAGNOSIS — Z0001 Encounter for general adult medical examination with abnormal findings: Secondary | ICD-10-CM | POA: Diagnosis not present

## 2019-07-29 DIAGNOSIS — Z6834 Body mass index (BMI) 34.0-34.9, adult: Secondary | ICD-10-CM | POA: Diagnosis not present

## 2019-07-29 DIAGNOSIS — R6882 Decreased libido: Secondary | ICD-10-CM

## 2019-07-29 MED ORDER — FLUOXETINE HCL 20 MG PO CAPS: 20.0000 mg | ORAL_CAPSULE | Freq: Every day | ORAL | 3 refills | Status: DC

## 2019-07-29 NOTE — Progress Notes (Signed)
Subjective:  Patient ID: Keith Ross, male    DOB: 18-Oct-1990, 28 y.o.   MRN: [id number]  Patient Care Team: Baruch Gouty, FNP as PCP - General (Family Medicine) Jaylia Pettus, Connye Burkitt, FNP (Family Medicine)   Chief Complaint:  Establish Care and Annual Exam   HPI: Keith Ross is a 28 y.o. male presenting on 07/29/2019 for Establish Care and Annual Exam   Pt presents today for an annual physical exam. States he has not seen a PCP in several years. States he is doing ok overall. States he is a new father, has a 69 month old daughter. States he has adjusted well to being a father but does have some anxiety and depression. States he had issues with anxiety, anger, depression, and OCD in his teenage years. States the OCD tendencies are still present but are now causing increased anxiety and frustration. States he has to have his closet organized by color and his radio has to be on a certain volume at all times or this causes increased anxiety. States he does have days with a depressed mood and lack of interest in activities and sex. States he does get agitated at minor things and this is starting to affect his marriage.    GAD 7 : Generalized Anxiety Score 07/29/2019  Nervous, Anxious, on Edge 2  Control/stop worrying 1  Worry too much - different things 1  Trouble relaxing 2  Restless 2  Easily annoyed or irritable 3  Afraid - awful might happen 2  Total GAD 7 Score 13  Anxiety Difficulty Somewhat difficult    Depression screen [facility] 2/9 07/29/2019 07/14/2015  Decreased Interest 2 0  Down, Depressed, Hopeless 2 0  PHQ - 2 Score 4 0  Altered sleeping 3 -  Tired, decreased energy 3 -  Change in appetite 1 -  Feeling bad or failure about yourself  1 -  Trouble concentrating 1 -  Moving slowly or fidgety/restless 2 -  Suicidal thoughts 0 -  PHQ-9 Score 15 -  Difficult doing work/chores Somewhat difficult -     Relevant past medical, surgical, family, and social history reviewed  and updated as indicated.  Allergies and medications reviewed and updated. Date reviewed: Chart in Epic.   Past Medical History:  Diagnosis Date  . Allergy     Past Surgical History:  Procedure Laterality Date  . TONSILLECTOMY      Social History   Socioeconomic History  . Marital status: Married    Spouse name: [name]  . Number of children: 1  . Years of education: Not on file  . Highest education level: Not on file  Occupational History  . Not on file  Tobacco Use  . Smoking status: Current Every Day Smoker    Packs/day: 1.00    Types: Cigarettes  . Smokeless tobacco: Never Used  Substance and Sexual Activity  . Alcohol use: No  . Drug use: No  . Sexual activity: Not on file  Other Topics Concern  . Not on file  Social History Narrative  . Not on file   Social Determinants of Health   Financial Resource Strain:   . Difficulty of Paying Living Expenses: Not on file  Food Insecurity:   . Worried About Charity fundraiser in the Last Year: Not on file  . Ran Out of Food in the Last Year: Not on file  Transportation Needs:   . Lack of Transportation (Medical): Not on file  .  Lack of Transportation (Non-Medical): Not on file  Physical Activity:   . Days of Exercise per Week: Not on file  . Minutes of Exercise per Session: Not on file  Stress:   . Feeling of Stress : Not on file  Social Connections:   . Frequency of Communication with Friends and Family: Not on file  . Frequency of Social Gatherings with Friends and Family: Not on file  . Attends Religious Services: Not on file  . Active Member of Clubs or Organizations: Not on file  . Attends Archivist Meetings: Not on file  . Marital Status: Not on file  Intimate Partner Violence:   . Fear of Current or Ex-Partner: Not on file  . Emotionally Abused: Not on file  . Physically Abused: Not on file  . Sexually Abused: Not on file    Outpatient Encounter Medications as of 07/29/2019    Medication Sig  . FLUoxetine (PROZAC) 20 MG capsule Take 1 capsule (20 mg total) by mouth daily.  . [DISCONTINUED] ibuprofen (ADVIL,MOTRIN) 800 MG tablet Take 1 tablet (800 mg total) by mouth once.  . [DISCONTINUED] mupirocin ointment (BACTROBAN) 2 % Apply 1 application topically 2 (two) times daily. Left 4th finger x 10 days  . [DISCONTINUED] ondansetron (ZOFRAN-ODT) 4 MG disintegrating tablet Take 1 tablet (4 mg total) by mouth every 8 (eight) hours as needed for nausea or vomiting.  . [DISCONTINUED] sulfamethoxazole-trimethoprim (BACTRIM DS,SEPTRA DS) 800-160 MG tablet Take 1 tablet by mouth 2 (two) times daily.   No facility-administered encounter medications on file as of 07/29/2019.    Allergies  Allergen Reactions  . Latex Rash    Review of Systems  Constitutional: Positive for activity change, appetite change and fatigue. Negative for chills, diaphoresis, fever and unexpected weight change.  HENT: Negative.   Eyes: Negative.  Negative for photophobia and visual disturbance.  Respiratory: Negative for cough, chest tightness and shortness of breath.   Cardiovascular: Negative for chest pain, palpitations and leg swelling.  Gastrointestinal: Negative for abdominal pain, blood in stool, constipation, diarrhea, nausea and vomiting.  Endocrine: Negative.  Negative for polydipsia, polyphagia and polyuria.  Genitourinary: Negative for decreased urine volume, difficulty urinating, discharge, dysuria, frequency, penile pain, penile swelling, scrotal swelling, testicular pain and urgency.  Musculoskeletal: Negative for arthralgias and myalgias.  Skin: Negative.   Allergic/Immunologic: Negative.   Neurological: Negative for dizziness, tremors, seizures, syncope, facial asymmetry, speech difficulty, weakness, light-headedness, numbness and headaches.  Hematological: Negative.   Psychiatric/Behavioral: Positive for agitation, decreased concentration, dysphoric mood and sleep disturbance.  Negative for behavioral problems, confusion, hallucinations, self-injury and suicidal ideas. The patient is nervous/anxious. The patient is not hyperactive.   All other systems reviewed and are negative.       Objective:  BP 125/80   Pulse 90   Temp 99 F (37.2 C)   Resp 20   Ht 6' (1.829 m)   Wt 253 lb (114.8 kg)   SpO2 98%   BMI 34.31 kg/m    Wt Readings from Last 3 Encounters:  07/29/19 253 lb (114.8 kg)  08/19/15 245 lb (111.1 kg)  07/14/15 241 lb 3.2 oz (109.4 kg)    Physical Exam Vitals and nursing note reviewed.  Constitutional:      General: He is not in acute distress.    Appearance: Normal appearance. He is well-developed and well-groomed. He is obese. He is not ill-appearing, toxic-appearing or diaphoretic.  HENT:     Head: Normocephalic and atraumatic.  Jaw: There is normal jaw occlusion.     Right Ear: Hearing, tympanic membrane, ear canal and external ear normal.     Left Ear: Hearing, tympanic membrane, ear canal and external ear normal.     Nose: Nose normal.     Mouth/Throat:     Lips: Pink.     Mouth: Mucous membranes are moist.     Pharynx: Oropharynx is clear. Uvula midline.  Eyes:     General: Lids are normal.     Extraocular Movements: Extraocular movements intact.     Conjunctiva/sclera: Conjunctivae normal.     Pupils: Pupils are equal, round, and reactive to light.  Neck:     Thyroid: No thyroid mass, thyromegaly or thyroid tenderness.     Vascular: No carotid bruit or JVD.     Trachea: Trachea and phonation normal.  Cardiovascular:     Rate and Rhythm: Normal rate and regular rhythm.     Chest Wall: PMI is not displaced.     Pulses: Normal pulses.     Heart sounds: Normal heart sounds. No murmur. No friction rub. No gallop.   Pulmonary:     Effort: Pulmonary effort is normal. No respiratory distress.     Breath sounds: Normal breath sounds. No wheezing.  Abdominal:     General: Bowel sounds are normal. There is no distension or  abdominal bruit.     Palpations: Abdomen is soft. There is no hepatomegaly or splenomegaly.     Tenderness: There is no abdominal tenderness. There is no right CVA tenderness or left CVA tenderness.     Hernia: No hernia is present.  Musculoskeletal:        General: Normal range of motion.     Cervical back: Normal range of motion and neck supple.     Right lower leg: No edema.     Left lower leg: No edema.  Lymphadenopathy:     Cervical: No cervical adenopathy.  Skin:    General: Skin is warm and dry.     Capillary Refill: Capillary refill takes less than 2 seconds.     Coloration: Skin is not cyanotic, jaundiced or pale.     Findings: No rash.  Neurological:     General: No focal deficit present.     Mental Status: He is alert and oriented to person, place, and time.     Cranial Nerves: Cranial nerves are intact. No cranial nerve deficit.     Sensory: Sensation is intact. No sensory deficit.     Motor: Motor function is intact. No weakness.     Coordination: Coordination is intact. Coordination normal.     Gait: Gait is intact. Gait normal.     Deep Tendon Reflexes: Reflexes are normal and symmetric. Reflexes normal.  Psychiatric:        Attention and Perception: Attention and perception normal.        Mood and Affect: Mood and affect normal.        Speech: Speech normal.        Behavior: Behavior normal. Behavior is cooperative.        Thought Content: Thought content normal. Thought content is not paranoid or delusional. Thought content does not include homicidal or suicidal ideation. Thought content does not include homicidal or suicidal plan.        Cognition and Memory: Cognition and memory normal.        Judgment: Judgment normal.     Results for orders placed or performed during  the hospital encounter of 09/29/12  CBC with Differential  Result Value Ref Range   WBC 13.0 (H) 4.0 - 10.5 K/uL   RBC 6.51 (H) 4.22 - 5.81 MIL/uL   Hemoglobin 20.0 (H) 13.0 - 17.0 g/dL    HCT 55.0 (H) 39.0 - 52.0 %   MCV 84.5 78.0 - 100.0 fL   MCH 30.7 26.0 - 34.0 pg   MCHC 36.4 (H) 30.0 - 36.0 g/dL   RDW 12.7 11.5 - 15.5 %   Platelets 236 150 - 400 K/uL   Neutrophils Relative % 92 (H) 43 - 77 %   Neutro Abs 11.9 (H) 1.7 - 7.7 K/uL   Lymphocytes Relative 5 (L) 12 - 46 %   Lymphs Abs 0.6 (L) 0.7 - 4.0 K/uL   Monocytes Relative 3 3 - 12 %   Monocytes Absolute 0.4 0.1 - 1.0 K/uL   Eosinophils Relative 0 0 - 5 %   Eosinophils Absolute 0.0 0.0 - 0.7 K/uL   Basophils Relative 0 0 - 1 %   Basophils Absolute 0.0 0.0 - 0.1 K/uL  Comprehensive metabolic panel  Result Value Ref Range   Sodium 141 135 - 145 mEq/L   Potassium 4.0 3.5 - 5.1 mEq/L   Chloride 100 96 - 112 mEq/L   CO2 23 19 - 32 mEq/L   Glucose, Bld 145 (H) 70 - 99 mg/dL   BUN 18 6 - 23 mg/dL   Creatinine, Ser 0.99 0.50 - 1.35 mg/dL   Calcium 10.5 8.4 - 10.5 mg/dL   Total Protein 8.0 6.0 - 8.3 g/dL   Albumin 5.0 3.5 - 5.2 g/dL   AST 19 0 - 37 U/L   ALT 30 0 - 53 U/L   Alkaline Phosphatase 110 39 - 117 U/L   Total Bilirubin 1.2 0.3 - 1.2 mg/dL   GFR calc non Af Amer >90 >90 mL/min   GFR calc Af Amer >90 >90 mL/min       Pertinent labs & imaging results that were available during my care of the patient were reviewed by me and considered in my medical decision making.  Assessment & Plan:  Jyson was seen today for establish care and annual exam.  Diagnoses and all orders for this visit:  Encounter for general adult medical examination with abnormal findings Health maintenance, diet, and exercise discussed. Labs pending.  -     CBC with Differential; Future -     Comprehensive metabolic panel; Future -     HIV antibody; Future -     Lipid panel; Future -     TSH; Future  BMI 34.0-34.9,adult Diet and exercise discussed. Labs pending.  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 Will check TSH and initiate fluoxetine. Report any new, worsening, or persistent symptoms. Follow up in 4 weeks.  -     FLUoxetine (PROZAC) 20 MG capsule; Take 1 capsule (20 mg total) by mouth daily. -     TSH; Future  Low libido Complains of decreased libido and would like testosterone checked. Pt aware this needs to be completed before 10 am, he will return for lab work.  -     Testosterone,Free and Total; Future -     TSH; Future  Continue all other maintenance medications.  Follow up plan: Return in 4 weeks (on 08/26/2019), or if symptoms worsen or fail to improve, for GAD, depression .  Continue healthy lifestyle choices, including diet (rich in fruits, vegetables, and lean proteins, and low in salt and simple carbohydrates) and exercise (at least 30 minutes of moderate physical activity daily).  Educational handout given for health maintenance  The above assessment and management plan was discussed with the patient. The patient verbalized understanding of and has agreed to the management plan. Patient is aware to call the clinic if they develop any new symptoms or if symptoms persist or worsen. Patient is aware when to return to the clinic for a follow-up visit. Patient educated on when it is appropriate to go to the emergency department.   Monia Pouch, FNP-C [facility] [phone]

## 2019-07-29 NOTE — Patient Instructions (Signed)

## 2019-08-23 ENCOUNTER — Other Ambulatory Visit: Payer: Self-pay

## 2019-08-23 ENCOUNTER — Other Ambulatory Visit: Payer: BC Managed Care – PPO

## 2019-08-23 DIAGNOSIS — R6882 Decreased libido: Secondary | ICD-10-CM

## 2019-08-23 DIAGNOSIS — F339 Major depressive disorder, recurrent, unspecified: Secondary | ICD-10-CM

## 2019-08-23 DIAGNOSIS — F411 Generalized anxiety disorder: Secondary | ICD-10-CM | POA: Diagnosis not present

## 2019-08-23 DIAGNOSIS — Z6834 Body mass index (BMI) 34.0-34.9, adult: Secondary | ICD-10-CM

## 2019-08-23 DIAGNOSIS — Z0001 Encounter for general adult medical examination with abnormal findings: Secondary | ICD-10-CM

## 2019-08-24 LAB — COMPREHENSIVE METABOLIC PANEL WITH GFR
ALT: 42 IU/L (ref 0–44)
AST: 16 IU/L (ref 0–40)
Albumin/Globulin Ratio: 2.5 — ABNORMAL HIGH (ref 1.2–2.2)
Albumin: 4.8 g/dL (ref 4.1–5.2)
Alkaline Phosphatase: 97 IU/L (ref 39–117)
BUN/Creatinine Ratio: 10 (ref 9–20)
BUN: 12 mg/dL (ref 6–20)
Bilirubin Total: 0.3 mg/dL (ref 0.0–1.2)
CO2: 22 mmol/L (ref 20–29)
Calcium: 10.3 mg/dL — ABNORMAL HIGH (ref 8.7–10.2)
Chloride: 104 mmol/L (ref 96–106)
Creatinine, Ser: 1.16 mg/dL (ref 0.76–1.27)
GFR calc Af Amer: 99 mL/min/1.73
GFR calc non Af Amer: 86 mL/min/1.73
Globulin, Total: 1.9 g/dL (ref 1.5–4.5)
Glucose: 87 mg/dL (ref 65–99)
Potassium: 4.5 mmol/L (ref 3.5–5.2)
Sodium: 140 mmol/L (ref 134–144)
Total Protein: 6.7 g/dL (ref 6.0–8.5)

## 2019-08-24 LAB — CBC WITH DIFFERENTIAL/PLATELET
Basophils Absolute: 0.1 x10E3/uL (ref 0.0–0.2)
Basos: 1 %
EOS (ABSOLUTE): 0.2 x10E3/uL (ref 0.0–0.4)
Eos: 2 %
Hematocrit: 51.9 % — ABNORMAL HIGH (ref 37.5–51.0)
Hemoglobin: 18 g/dL — ABNORMAL HIGH (ref 13.0–17.7)
Immature Grans (Abs): 0 x10E3/uL (ref 0.0–0.1)
Immature Granulocytes: 0 %
Lymphocytes Absolute: 2.5 x10E3/uL (ref 0.7–3.1)
Lymphs: 30 %
MCH: 30.6 pg (ref 26.6–33.0)
MCHC: 34.7 g/dL (ref 31.5–35.7)
MCV: 88 fL (ref 79–97)
Monocytes Absolute: 0.6 x10E3/uL (ref 0.1–0.9)
Monocytes: 7 %
Neutrophils Absolute: 5 x10E3/uL (ref 1.4–7.0)
Neutrophils: 60 %
Platelets: 267 x10E3/uL (ref 150–450)
RBC: 5.88 x10E6/uL — ABNORMAL HIGH (ref 4.14–5.80)
RDW: 12.4 % (ref 11.6–15.4)
WBC: 8.5 x10E3/uL (ref 3.4–10.8)

## 2019-08-24 LAB — LIPID PANEL
Chol/HDL Ratio: 5.2 ratio — ABNORMAL HIGH (ref 0.0–5.0)
Cholesterol, Total: 172 mg/dL (ref 100–199)
HDL: 33 mg/dL — ABNORMAL LOW
LDL Chol Calc (NIH): 111 mg/dL — ABNORMAL HIGH (ref 0–99)
Triglycerides: 154 mg/dL — ABNORMAL HIGH (ref 0–149)
VLDL Cholesterol Cal: 28 mg/dL (ref 5–40)

## 2019-08-24 LAB — TESTOSTERONE,FREE AND TOTAL
Testosterone, Free: 12.3 pg/mL (ref 9.3–26.5)
Testosterone: 328 ng/dL (ref 264–916)

## 2019-08-24 LAB — TSH: TSH: 2.73 u[IU]/mL (ref 0.450–4.500)

## 2019-08-24 LAB — HIV ANTIBODY (ROUTINE TESTING W REFLEX): HIV Screen 4th Generation wRfx: NONREACTIVE

## 2019-08-30 ENCOUNTER — Other Ambulatory Visit: Payer: Self-pay

## 2019-08-30 ENCOUNTER — Encounter: Payer: Self-pay | Admitting: Family Medicine

## 2019-08-30 ENCOUNTER — Ambulatory Visit: Payer: BC Managed Care – PPO | Admitting: Family Medicine

## 2019-08-30 VITALS — BP 128/75 | HR 80 | Temp 99.0°F | Resp 20 | Ht 72.0 in | Wt 256.0 lb

## 2019-08-30 DIAGNOSIS — F339 Major depressive disorder, recurrent, unspecified: Secondary | ICD-10-CM

## 2019-08-30 DIAGNOSIS — R6882 Decreased libido: Secondary | ICD-10-CM

## 2019-08-30 DIAGNOSIS — F411 Generalized anxiety disorder: Secondary | ICD-10-CM | POA: Diagnosis not present

## 2019-08-30 NOTE — Progress Notes (Signed)
Subjective:  Patient ID: Keith Ross, male    DOB: 12-Jan-1991, 29 y.o.   MRN: [id number]  Patient Care Team: Baruch Gouty, FNP as PCP - General (Family Medicine) Marixa Mellott, Connye Burkitt, FNP (Family Medicine)   Chief Complaint:  Depression (4 week follow up )   HPI: Keith Ross is a 29 y.o. male presenting on 08/30/2019 for Depression (4 week follow up )   Depression        This is a recurrent problem.  The current episode started more than 1 month ago.   The problem occurs daily.  The problem has been gradually improving since onset.  Associated symptoms include fatigue, helplessness, hopelessness, restlessness, decreased interest and appetite change.  Associated symptoms include no decreased concentration, does not have insomnia, not irritable, no body aches, no myalgias, no headaches, no indigestion, not sad and no suicidal ideas.     The symptoms are aggravated by family issues and work stress.  Past treatments include SSRIs - Selective serotonin reuptake inhibitors.  Compliance with treatment is good.  Previous treatment provided moderate relief.  Past medical history includes anxiety.   Anxiety Presents for follow-up visit. Symptoms include compulsions, depressed mood, excessive worry, obsessions and restlessness. Patient reports no chest pain, confusion, decreased concentration, dizziness, dry mouth, feeling of choking, hyperventilation, insomnia, irritability, malaise, muscle tension, nausea, nervous/anxious behavior, palpitations, panic, shortness of breath or suicidal ideas. Impotence: decreaed interest. The severity of symptoms is mild. The quality of sleep is good. Nighttime awakenings: none.   Compliance with medications is 76-100%.    Depression screen [facility] 2/9 08/30/2019 07/29/2019 07/14/2015  Decreased Interest 1 2 0  Down, Depressed, Hopeless 1 2 0  PHQ - 2 Score 2 4 0  Altered sleeping 0 3 -  Tired, decreased energy 1 3 -  Change in appetite 2 1 -  Feeling bad or  failure about yourself  0 1 -  Trouble concentrating 0 1 -  Moving slowly or fidgety/restless 1 2 -  Suicidal thoughts 0 0 -  PHQ-9 Score 6 15 -  Difficult doing work/chores Somewhat difficult Somewhat difficult -   GAD 7 : Generalized Anxiety Score 08/30/2019 07/29/2019  Nervous, Anxious, on Edge 0 2  Control/stop worrying 0 1  Worry too much - different things 1 1  Trouble relaxing 0 2  Restless 1 2  Easily annoyed or irritable 0 3  Afraid - awful might happen 1 2  Total GAD 7 Score 3 13  Anxiety Difficulty Somewhat difficult Somewhat difficult      Relevant past medical, surgical, family, and social history reviewed and updated as indicated.  Allergies and medications reviewed and updated. Date reviewed: Chart in Epic.   Past Medical History:  Diagnosis Date  . Allergy     Past Surgical History:  Procedure Laterality Date  . TONSILLECTOMY      Social History   Socioeconomic History  . Marital status: Married    Spouse name: [name]  . Number of children: 1  . Years of education: Not on file  . Highest education level: Not on file  Occupational History  . Not on file  Tobacco Use  . Smoking status: Current Every Day Smoker    Packs/day: 1.00    Types: Cigarettes  . Smokeless tobacco: Never Used  Substance and Sexual Activity  . Alcohol use: No  . Drug use: No  . Sexual activity: Not on file  Other Topics Concern  . Not  on file  Social History Narrative  . Not on file   Social Determinants of Health   Financial Resource Strain:   . Difficulty of Paying Living Expenses: Not on file  Food Insecurity:   . Worried About Charity fundraiser in the Last Year: Not on file  . Ran Out of Food in the Last Year: Not on file  Transportation Needs:   . Lack of Transportation (Medical): Not on file  . Lack of Transportation (Non-Medical): Not on file  Physical Activity:   . Days of Exercise per Week: Not on file  . Minutes of Exercise per Session: Not on file   Stress:   . Feeling of Stress : Not on file  Social Connections:   . Frequency of Communication with Friends and Family: Not on file  . Frequency of Social Gatherings with Friends and Family: Not on file  . Attends Religious Services: Not on file  . Active Member of Clubs or Organizations: Not on file  . Attends Archivist Meetings: Not on file  . Marital Status: Not on file  Intimate Partner Violence:   . Fear of Current or Ex-Partner: Not on file  . Emotionally Abused: Not on file  . Physically Abused: Not on file  . Sexually Abused: Not on file    Outpatient Encounter Medications as of 08/30/2019  Medication Sig  . FLUoxetine (PROZAC) 20 MG capsule Take 1 capsule (20 mg total) by mouth daily.   No facility-administered encounter medications on file as of 08/30/2019.    Allergies  Allergen Reactions  . Latex Rash    Review of Systems  Constitutional: Positive for appetite change and fatigue. Negative for activity change, chills, diaphoresis, fever, irritability and unexpected weight change.  HENT: Negative.   Eyes: Negative.  Negative for photophobia and visual disturbance.  Respiratory: Negative for cough, chest tightness and shortness of breath.   Cardiovascular: Negative for chest pain, palpitations and leg swelling.  Gastrointestinal: Negative for abdominal pain, blood in stool, constipation, diarrhea, nausea and vomiting.  Endocrine: Negative.   Genitourinary: Negative for decreased urine volume, difficulty urinating, dysuria, frequency and urgency. Impotence: decreaed interest.  Musculoskeletal: Negative for arthralgias and myalgias.  Skin: Negative.   Allergic/Immunologic: Negative.   Neurological: Negative for dizziness, tremors, seizures, syncope, facial asymmetry, speech difficulty, weakness, light-headedness, numbness and headaches.  Hematological: Negative.   Psychiatric/Behavioral: Positive for depression and dysphoric mood. Negative for confusion,  decreased concentration, hallucinations, self-injury, sleep disturbance and suicidal ideas. The patient is not nervous/anxious and does not have insomnia.   All other systems reviewed and are negative.       Objective:  BP 128/75   Pulse 80   Temp 99 F (37.2 C)   Resp 20   Ht 6' (1.829 m)   Wt 256 lb (116.1 kg)   SpO2 97%   BMI 34.72 kg/m    Wt Readings from Last 3 Encounters:  08/30/19 256 lb (116.1 kg)  07/29/19 253 lb (114.8 kg)  08/19/15 245 lb (111.1 kg)    Physical Exam Vitals and nursing note reviewed.  Constitutional:      General: He is not irritable.He is not in acute distress.    Appearance: Normal appearance. He is well-developed and well-groomed. He is obese. He is not ill-appearing, toxic-appearing or diaphoretic.  HENT:     Head: Normocephalic and atraumatic.     Jaw: There is normal jaw occlusion.     Right Ear: Hearing normal.  Left Ear: Hearing normal.     Nose: Nose normal.     Mouth/Throat:     Lips: Pink.     Mouth: Mucous membranes are moist.     Pharynx: Oropharynx is clear. Uvula midline.  Eyes:     General: Lids are normal.     Extraocular Movements: Extraocular movements intact.     Conjunctiva/sclera: Conjunctivae normal.     Pupils: Pupils are equal, round, and reactive to light.  Neck:     Thyroid: No thyroid mass, thyromegaly or thyroid tenderness.     Vascular: No carotid bruit or JVD.     Trachea: Trachea and phonation normal.  Cardiovascular:     Rate and Rhythm: Normal rate and regular rhythm.     Chest Wall: PMI is not displaced.     Pulses: Normal pulses.     Heart sounds: Normal heart sounds. No murmur. No friction rub. No gallop.   Pulmonary:     Effort: Pulmonary effort is normal. No respiratory distress.     Breath sounds: Normal breath sounds. No wheezing.  Abdominal:     General: Bowel sounds are normal. There is no distension or abdominal bruit.     Palpations: Abdomen is soft. There is no hepatomegaly or  splenomegaly.     Tenderness: There is no abdominal tenderness. There is no right CVA tenderness or left CVA tenderness.     Hernia: No hernia is present.  Musculoskeletal:        General: Normal range of motion.     Cervical back: Normal range of motion and neck supple.     Right lower leg: No edema.     Left lower leg: No edema.  Lymphadenopathy:     Cervical: No cervical adenopathy.  Skin:    General: Skin is warm and dry.     Capillary Refill: Capillary refill takes less than 2 seconds.     Coloration: Skin is not cyanotic, jaundiced or pale.     Findings: No rash.  Neurological:     General: No focal deficit present.     Mental Status: He is alert and oriented to person, place, and time.     Cranial Nerves: Cranial nerves are intact.     Sensory: Sensation is intact.     Motor: Motor function is intact.     Coordination: Coordination is intact.     Gait: Gait is intact.     Deep Tendon Reflexes: Reflexes are normal and symmetric.  Psychiatric:        Attention and Perception: Attention and perception normal.        Mood and Affect: Mood and affect normal.        Speech: Speech normal.        Behavior: Behavior normal. Behavior is cooperative.        Thought Content: Thought content normal. Thought content does not include homicidal or suicidal ideation. Thought content does not include homicidal or suicidal plan.        Cognition and Memory: Cognition and memory normal.        Judgment: Judgment normal.     Results for orders placed or performed in visit on 08/23/19  TSH  Result Value Ref Range   TSH 2.730 0.450 - 4.500 uIU/mL  Lipid panel  Result Value Ref Range   Cholesterol, Total 172 100 - 199 mg/dL   Triglycerides 154 (H) 0 - 149 mg/dL   HDL 33 (L) >39 mg/dL   VLDL  Cholesterol Cal 28 5 - 40 mg/dL   LDL Chol Calc (NIH) 111 (H) 0 - 99 mg/dL   Chol/HDL Ratio 5.2 (H) 0.0 - 5.0 ratio  HIV antibody  Result Value Ref Range   HIV Screen 4th Generation wRfx Non  Reactive Non Reactive  Comprehensive metabolic panel  Result Value Ref Range   Glucose 87 65 - 99 mg/dL   BUN 12 6 - 20 mg/dL   Creatinine, Ser 1.16 0.76 - 1.27 mg/dL   GFR calc non Af Amer 86 >59 mL/min/1.73   GFR calc Af Amer 99 >59 mL/min/1.73   BUN/Creatinine Ratio 10 9 - 20   Sodium 140 134 - 144 mmol/L   Potassium 4.5 3.5 - 5.2 mmol/L   Chloride 104 96 - 106 mmol/L   CO2 22 20 - 29 mmol/L   Calcium 10.3 (H) 8.7 - 10.2 mg/dL   Total Protein 6.7 6.0 - 8.5 g/dL   Albumin 4.8 4.1 - 5.2 g/dL   Globulin, Total 1.9 1.5 - 4.5 g/dL   Albumin/Globulin Ratio 2.5 (H) 1.2 - 2.2   Bilirubin Total 0.3 0.0 - 1.2 mg/dL   Alkaline Phosphatase 97 39 - 117 IU/L   AST 16 0 - 40 IU/L   ALT 42 0 - 44 IU/L  CBC with Differential  Result Value Ref Range   WBC 8.5 3.4 - 10.8 x10E3/uL   RBC 5.88 (H) 4.14 - 5.80 x10E6/uL   Hemoglobin 18.0 (H) 13.0 - 17.7 g/dL   Hematocrit 51.9 (H) 37.5 - 51.0 %   MCV 88 79 - 97 fL   MCH 30.6 26.6 - 33.0 pg   MCHC 34.7 31.5 - 35.7 g/dL   RDW 12.4 11.6 - 15.4 %   Platelets 267 150 - 450 x10E3/uL   Neutrophils 60 Not Estab. %   Lymphs 30 Not Estab. %   Monocytes 7 Not Estab. %   Eos 2 Not Estab. %   Basos 1 Not Estab. %   Neutrophils Absolute 5.0 1.4 - 7.0 x10E3/uL   Lymphocytes Absolute 2.5 0.7 - 3.1 x10E3/uL   Monocytes Absolute 0.6 0.1 - 0.9 x10E3/uL   EOS (ABSOLUTE) 0.2 0.0 - 0.4 x10E3/uL   Basophils Absolute 0.1 0.0 - 0.2 x10E3/uL   Immature Granulocytes 0 Not Estab. %   Immature Grans (Abs) 0.0 0.0 - 0.1 x10E3/uL  Testosterone,Free and Total  Result Value Ref Range   Testosterone 328 264 - 916 ng/dL   Testosterone, Free 12.3 9.3 - 26.5 pg/mL       Pertinent labs & imaging results that were available during my care of the patient were reviewed by me and considered in my medical decision making.  Assessment & Plan:  Keith Ross was seen today for depression.  Diagnoses and all orders for this visit:  Recurrent depression ([place]) GAD (generalized  anxiety disorder) Low libido Libido and mood has improved slightly. Pt states he is feeling a lot better. Some fatigue from the medication. Discussed changing dosing time to see if beneficial. Continue current therapy and follow up in 3 months or sooner if needed. Will recheck testosterone at this time to see if normal.   Discussed all lab results in detail today along with dietary changes that were needed to lower triglycerides and LDL. Diet and exercise encouraged.     Continue all other maintenance medications.  Follow up plan: Return in about 3 months (around 11/28/2019), or if symptoms worsen or fail to improve, for depression, testosterone .  Continue healthy lifestyle  choices, including diet (rich in fruits, vegetables, and lean proteins, and low in salt and simple carbohydrates) and exercise (at least 30 minutes of moderate physical activity daily).  Educational handout given for depression.   The above assessment and management plan was discussed with the patient. The patient verbalized understanding of and has agreed to the management plan. Patient is aware to call the clinic if they develop any new symptoms or if symptoms persist or worsen. Patient is aware when to return to the clinic for a follow-up visit. Patient educated on when it is appropriate to go to the emergency department.   Monia Pouch, FNP-C [facility] [phone]

## 2019-08-30 NOTE — Patient Instructions (Signed)
If your symptoms worsen or you have thoughts of suicide/homicide, PLEASE SEEK IMMEDIATE MEDICAL ATTENTION.  You may always call the National Suicide Hotline.  This is available 24 hours a day, 7 days a week.  Their number is: 1-800-273-8255  Taking the medicine as directed and not missing any doses is one of the best things you can do to treat your depression.  Here are some things to keep in mind:  1) Side effects (stomach upset, some increased anxiety) may happen before you notice a benefit.  These side effects typically go away over time. 2) Changes to your dose of medicine or a change in medication all together is sometimes necessary 3) Most people need to be on medication at least 12 months 4) Many people will notice an improvement within two weeks but the full effect of the medication can take up to 4-6 weeks 5) Stopping the medication when you start feeling better often results in a return of symptoms 6) Never discontinue your medication without contacting a health care professional first.  Some medications require gradual discontinuation/ taper and can make you sick if you stop them abruptly.  If your symptoms worsen or you have thoughts of suicide/homicide, PLEASE SEEK IMMEDIATE MEDICAL ATTENTION.  You may always call:  National Suicide Hotline: 800-273-8255 [place] Crisis Line: 336-832-9700 Crisis Recovery in [place]: 800-939-5911   These are available 24 hours a day, 7 days a week.   

## 2019-09-01 DIAGNOSIS — M5440 Lumbago with sciatica, unspecified side: Secondary | ICD-10-CM | POA: Diagnosis not present

## 2019-09-01 DIAGNOSIS — S134XXA Sprain of ligaments of cervical spine, initial encounter: Secondary | ICD-10-CM | POA: Diagnosis not present

## 2019-09-01 DIAGNOSIS — S233XXA Sprain of ligaments of thoracic spine, initial encounter: Secondary | ICD-10-CM | POA: Diagnosis not present

## 2019-11-02 ENCOUNTER — Encounter: Payer: Self-pay | Admitting: *Deleted

## 2019-12-08 ENCOUNTER — Ambulatory Visit: Payer: BC Managed Care – PPO | Admitting: Family Medicine

## 2019-12-08 ENCOUNTER — Other Ambulatory Visit: Payer: Self-pay

## 2019-12-08 ENCOUNTER — Encounter: Payer: Self-pay | Admitting: Family Medicine

## 2019-12-08 VITALS — BP 133/87 | HR 77 | Temp 98.0°F | Ht 72.0 in | Wt 259.0 lb

## 2019-12-08 DIAGNOSIS — Z7689 Persons encountering health services in other specified circumstances: Secondary | ICD-10-CM | POA: Diagnosis not present

## 2019-12-08 DIAGNOSIS — Z720 Tobacco use: Secondary | ICD-10-CM | POA: Diagnosis not present

## 2019-12-08 DIAGNOSIS — F339 Major depressive disorder, recurrent, unspecified: Secondary | ICD-10-CM | POA: Diagnosis not present

## 2019-12-08 DIAGNOSIS — F411 Generalized anxiety disorder: Secondary | ICD-10-CM

## 2019-12-08 DIAGNOSIS — R6882 Decreased libido: Secondary | ICD-10-CM

## 2019-12-08 MED ORDER — FLUOXETINE HCL 20 MG PO CAPS: 20.0000 mg | ORAL_CAPSULE | Freq: Every day | ORAL | 3 refills | Status: DC

## 2019-12-08 MED ORDER — BUPROPION HCL ER (SR) 150 MG PO TB12: 150.0000 mg | ORAL_TABLET | Freq: Every morning | ORAL | 1 refills | Status: DC

## 2019-12-08 NOTE — Patient Instructions (Signed)
We discussed the risk (lowered seizure threshold in epileptics/ known seizure disorders) and benefit (improved concentration, improvement mood/ depression, libido, smoking cessation and appetite suppression) of wellbutrin  Taking the medicine as directed and not missing any doses is one of the best things you can do to treat your depression.  Here are some things to keep in mind:  1) Side effects (stomach upset, some increased anxiety) may happen before you notice a benefit.  These side effects typically go away over time. 2) Changes to your dose of medicine or a change in medication all together is sometimes necessary 3) Most people need to be on medication at least 12 months 4) Many people will notice an improvement within two weeks but the full effect of the medication can take up to 4-6 weeks 5) Stopping the medication when you start feeling better often results in a return of symptoms 6) Never discontinue your medication without contacting a health care professional first.  Some medications require gradual discontinuation/ taper and can make you sick if you stop them abruptly.  If your symptoms worsen or you have thoughts of suicide/homicide, PLEASE SEEK IMMEDIATE MEDICAL ATTENTION.  You may always call:  National Suicide Hotline: (934)060-9386 [place]: 534-038-9636 Crisis Recovery in [place]: 680-188-8654   These are available 24 hours a day, 7 days a week.

## 2019-12-08 NOTE — Progress Notes (Signed)
Subjective: CC: f/u GAD/ Depression, est care PCP: Keith Gouty, FNP [id number] Keith Ross is a 29 y.o. male presenting to clinic today for:  1. Depression/ GAD Patient here for interval follow up on GAD/ Depression.  He notes he is always had an underlying mood disorder but that symptoms really started manifesting after the birth of his daughter, who is now 75 months old.  There apparently were some complications during pregnancy when his wife was sick with Covid and the baby subsequently suffered a small stroke and after birth a seizure disorder.  She was recently weaned from [place] and is doing well now.  She seems to be meeting all milestones.  He does report increased stress and moodiness surrounding being a new father.  The fluoxetine did seem to help quite a bit when it was initiated but he seems to have plateaued now.  He continues to have up-and-down days.  He has had some impact on libido.  He had testosterone checked but this was within normal range.  He denies any personal history of seizure disorder but notes that he has had some twitching episodes in the past of his left upper extremity.  Has been conscious during these episodes.  These were mentioned briefly to neurology but nothing was ever pursued.  Does not report any bladder or bowel incontinence during episodes.  No loss of consciousness.  No other family history of seizure disorder.  He is an active every day smoker and has been a smoker for 10 years.   ROS: Per HPI  Allergies  Allergen Reactions  . Latex Rash   Past Medical History:  Diagnosis Date  . Allergy     Current Outpatient Medications:  .  FLUoxetine (PROZAC) 20 MG capsule, Take 1 capsule (20 mg total) by mouth daily., Disp: 30 capsule, Rfl: 3 Social History   Socioeconomic History  . Marital status: Married    Spouse name: Keith Ross  . Number of children: 1  . Years of education: Not on file  . Highest education level: Not on file  Occupational History   . Not on file  Tobacco Use  . Smoking status: Current Every Day Smoker    Packs/day: 1.00    Types: Cigarettes  . Smokeless tobacco: Never Used  Substance and Sexual Activity  . Alcohol use: No  . Drug use: No  . Sexual activity: Not on file  Other Topics Concern  . Not on file  Social History Narrative  . Not on file   Social Determinants of Health   Financial Resource Strain:   . Difficulty of Paying Living Expenses:   Food Insecurity:   . Worried About Charity fundraiser in the Last Year:   . Arboriculturist in the Last Year:   Transportation Needs:   . Film/video editor (Medical):   Marland Kitchen Lack of Transportation (Non-Medical):   Physical Activity:   . Days of Exercise per Week:   . Minutes of Exercise per Session:   Stress:   . Feeling of Stress :   Social Connections:   . Frequency of Communication with Friends and Family:   . Frequency of Social Gatherings with Friends and Family:   . Attends Religious Services:   . Active Member of Clubs or Organizations:   . Attends Archivist Meetings:   Marland Kitchen Marital Status:   Intimate Partner Violence:   . Fear of Current or Ex-Partner:   . Emotionally Abused:   .  Physically Abused:   . Sexually Abused:    Family History  Problem Relation Age of Onset  . Depression Mother   . Anxiety disorder Mother   . Hypertension Father   . Diabetes Maternal Grandmother   . Hypertension Maternal Grandmother     Objective: Office vital signs reviewed. BP 133/87   Pulse 77   Temp 98 F (36.7 C) (Temporal)   Ht 6' (1.829 m)   Wt 259 lb (117.5 kg)   SpO2 99%   BMI 35.13 kg/m   Physical Examination:  General: Awake, alert, well nourished, No acute distress HEENT: Normal; sclera white.  Oropharynx without masses or erythema.  Moist mucous membranes. Cardio: regular rate and rhythm, S1S2 heard, no murmurs appreciated Pulm: clear to auscultation bilaterally, no wheezes, rhonchi or rales; normal work of breathing on  room air Extremities: warm, well perfused, No edema, cyanosis or clubbing; +2 pulses bilaterally Neuro: No focal neurologic deficits Psych: Mood stable, speech normal, affect appropriate, good eye contact, does not appear to be responding to internal stimuli Depression screen [facility] 2/9 12/08/2019 08/30/2019 07/29/2019  Decreased Interest 2 1 2   Down, Depressed, Hopeless 2 1 2   PHQ - 2 Score 4 2 4   Altered sleeping 0 0 3  Tired, decreased energy 2 1 3   Change in appetite 0 2 1  Feeling bad or failure about yourself  2 0 1  Trouble concentrating 3 0 1  Moving slowly or fidgety/restless 0 1 2  Suicidal thoughts 0 0 0  PHQ-9 Score 11 6 15   Difficult doing work/chores Somewhat difficult Somewhat difficult Somewhat difficult   GAD 7 : Generalized Anxiety Score 12/08/2019 08/30/2019 07/29/2019  Nervous, Anxious, on Edge 2 0 2  Control/stop worrying 2 0 1  Worry too much - different things 2 1 1   Trouble relaxing 2 0 2  Restless 2 1 2   Easily annoyed or irritable 2 0 3  Afraid - awful might happen 2 1 2   Total GAD 7 Score 14 3 13   Anxiety Difficulty Somewhat difficult Somewhat difficult Somewhat difficult    Assessment/ Plan: 29 y.o. male   1. Recurrent depression (Keith Ross) Not controlled.  Continue fluoxetine.  I have added Wellbutrin SR 150 mg.  No formal diagnosis of seizure disorder in the symptoms that he is describing does not sound consistent with a seizure.  However, we did discuss the possibility that Wellbutrin can lower the seizure threshold.  He voiced understanding will contact me immediately if he develops any unusual symptoms or signs.   - buPROPion (WELLBUTRIN SR) 150 MG 12 hr tablet; Take 1 tablet (150 mg total) by mouth in the morning.  Dispense: 30 tablet; Refill: 1 - FLUoxetine (PROZAC) 20 MG capsule; Take 1 capsule (20 mg total) by mouth daily.  Dispense: 30 capsule; Refill: 3  2. GAD (generalized anxiety disorder) Continue fluoxetine - FLUoxetine (PROZAC) 20 MG capsule;  Take 1 capsule (20 mg total) by mouth daily.  Dispense: 30 capsule; Refill: 3  3. Establishing care with new doctor, encounter for  4. Tobacco use - buPROPion (WELLBUTRIN SR) 150 MG 12 hr tablet; Take 1 tablet (150 mg total) by mouth in the morning.  Dispense: 30 tablet; Refill: 1  5. Low libido - buPROPion (WELLBUTRIN SR) 150 MG 12 hr tablet; Take 1 tablet (150 mg total) by mouth in the morning.  Dispense: 30 tablet; Refill: 1   No orders of the defined types were placed in this encounter.  No orders of the  defined types were placed in this encounter.    Janora Norlander, DO [place] [phone]

## 2020-01-07 ENCOUNTER — Ambulatory Visit (INDEPENDENT_AMBULATORY_CARE_PROVIDER_SITE_OTHER): Payer: BC Managed Care – PPO | Admitting: Family Medicine

## 2020-01-07 DIAGNOSIS — Z720 Tobacco use: Secondary | ICD-10-CM

## 2020-01-07 DIAGNOSIS — F411 Generalized anxiety disorder: Secondary | ICD-10-CM

## 2020-01-07 DIAGNOSIS — R6882 Decreased libido: Secondary | ICD-10-CM

## 2020-01-07 DIAGNOSIS — F339 Major depressive disorder, recurrent, unspecified: Secondary | ICD-10-CM | POA: Diagnosis not present

## 2020-01-07 MED ORDER — BUPROPION HCL ER (SR) 150 MG PO TB12: 150.0000 mg | ORAL_TABLET | Freq: Every morning | ORAL | 5 refills | Status: DC

## 2020-01-07 NOTE — Progress Notes (Signed)
Telephone visit  Subjective: CC: f/u mood PCP: Janora Norlander, DO [id number] Keith Ross is a 29 y.o. male calls for telephone consult today. Patient provides verbal consent for consult held via phone.  Due to COVID-19 pandemic this visit was conducted virtually. This visit type was conducted due to national recommendations for restrictions regarding the COVID-19 Pandemic (e.g. social distancing, sheltering in place) in an effort to limit this patient's exposure and mitigate transmission in our community. All issues noted in this document were discussed and addressed.  A physical exam was not performed with this format.   Location of patient: work Location of provider: [facility] Others present for call: none  1.  Depression/anxiety/libido Patient reports substantial improvement in symptoms with the addition of Wellbutrin.  He is tolerating medication without difficulty.  Denies any seizure activity.  With regards to intimacy, he reports that he and his wife are "teenagers again".  He seems very pleased with how things are going.  He has had a 50% reduction in smoking and is down to 1/2 pack/day.  He continues to take fluoxetine daily as directed.  No SI, HI.  ROS: Per HPI  Allergies  Allergen Reactions  . Latex Rash   Past Medical History:  Diagnosis Date  . Allergy   . Anxiety   . Depression     Current Outpatient Medications:  .  buPROPion (WELLBUTRIN SR) 150 MG 12 hr tablet, Take 1 tablet (150 mg total) by mouth in the morning., Disp: 30 tablet, Rfl: 1 .  FLUoxetine (PROZAC) 20 MG capsule, Take 1 capsule (20 mg total) by mouth daily., Disp: 30 capsule, Rfl: 3  Gen: no acute distress Psych: mood stable, speech normal. Thought linear, pleasant  Depression screen [facility] 2/9 01/07/2020 12/08/2019 08/30/2019  Decreased Interest 1 2 1   Down, Depressed, Hopeless 0 2 1  PHQ - 2 Score 1 4 2   Altered sleeping 0 0 0  Tired, decreased energy 1 2 1   Change in appetite 0 0 2  Feeling bad or  failure about yourself  0 2 0  Trouble concentrating 0 3 0  Moving slowly or fidgety/restless 0 0 1  Suicidal thoughts 0 0 0  PHQ-9 Score 2 11 6   Difficult doing work/chores Not difficult at all Somewhat difficult Somewhat difficult   GAD 7 : Generalized Anxiety Score 01/07/2020 12/08/2019 08/30/2019 07/29/2019  Nervous, Anxious, on Edge 1 2 0 2  Control/stop worrying 0 2 0 1  Worry too much - different things 0 2 1 1   Trouble relaxing 0 2 0 2  Restless 0 2 1 2   Easily annoyed or irritable 1 2 0 3  Afraid - awful might happen 0 2 1 2   Total GAD 7 Score 2 14 3 13   Anxiety Difficulty Not difficult at all Somewhat difficult Somewhat difficult Somewhat difficult     Assessment/ Plan: 29 y.o. male   1. Recurrent depression ([place]) Much improved.  Okay to follow-up in 6 months, sooner if needed - buPROPion (WELLBUTRIN SR) 150 MG 12 hr tablet; Take 1 tablet (150 mg total) by mouth in the morning.  Dispense: 30 tablet; Refill: 5  2. GAD (generalized anxiety disorder) Improved  3. Tobacco use Improving. Action phase. >50% reduction in last month - buPROPion (WELLBUTRIN SR) 150 MG 12 hr tablet; Take 1 tablet (150 mg total) by mouth in the morning.  Dispense: 30 tablet; Refill: 5  4. Low libido Resolved. - buPROPion (WELLBUTRIN SR) 150 MG 12 hr tablet; Take 1  tablet (150 mg total) by mouth in the morning.  Dispense: 30 tablet; Refill: 5   Start time: 11:39am End time: 11:44am  Total time spent on patient care (including telephone call/ virtual visit): 11 minutes  [place], [place] [phone]

## 2021-02-14 ENCOUNTER — Telehealth: Payer: BC Managed Care – PPO | Admitting: Family

## 2021-02-14 DIAGNOSIS — J019 Acute sinusitis, unspecified: Secondary | ICD-10-CM

## 2021-02-14 MED ORDER — AMOXICILLIN-POT CLAVULANATE 875-125 MG PO TABS: 1.0000 | ORAL_TABLET | Freq: Two times a day (BID) | ORAL | 0 refills | Status: DC

## 2021-02-14 NOTE — Progress Notes (Signed)
Virtual Visit Consent   Keith Ross, you are scheduled for a virtual visit with a [facility] provider today.     Just as with appointments in the office, your consent must be obtained to participate.  Your consent will be active for this visit and any virtual visit you may have with one of our providers in the next 365 days.     If you have a MyChart account, a copy of this consent can be sent to you electronically.  All virtual visits are billed to your insurance company just like a traditional visit in the office.    As this is a virtual visit, video technology does not allow for your provider to perform a traditional examination.  This may limit your provider's ability to fully assess your condition.  If your provider identifies any concerns that need to be evaluated in person or the need to arrange testing (such as labs, EKG, etc.), we will make arrangements to do so.     Although advances in technology are sophisticated, we cannot ensure that it will always work on either your end or our end.  If the connection with a video visit is poor, the visit may have to be switched to a telephone visit.  With either a video or telephone visit, we are not always able to ensure that we have a secure connection.     I need to obtain your verbal consent now.   Are you willing to proceed with your visit today?    Keith Ross has provided verbal consent on 02/14/2021 for a virtual visit (video or telephone).   Evelina Dun, FNP   Date: 02/14/2021 7:17 PM   Virtual Visit via Video Note   I, Evelina Dun, connected with  Keith Ross  ([id number], Sep 29, 1990) on 02/14/21 at  7:15 PM EDT by a video-enabled telemedicine application and verified that I am speaking with the correct person using two identifiers.  Location: Patient: Virtual Visit Location Patient: Home Provider: Virtual Visit Location Provider: Home   I discussed the limitations of evaluation and management by telemedicine and the  availability of in person appointments. The patient expressed understanding and agreed to proceed.    History of Present Illness: Keith Ross is a 30 y.o. who identifies as a male who was assigned male at birth, and is being seen today for Sinusitis .  HPI: Sinusitis This is a new problem. The current episode started 1 to 4 weeks ago. The problem has been gradually worsening since onset. There has been no fever. His pain is at a severity of 6/10. The pain is moderate. Associated symptoms include congestion, coughing, headaches, a hoarse voice, sinus pressure and sneezing. Pertinent negatives include no shortness of breath or sore throat. Past treatments include oral decongestants and acetaminophen. The treatment provided mild relief.   Problems:  Patient Active Problem List   Diagnosis Date Noted   Recurrent depression ([place]) 07/29/2019   GAD (generalized anxiety disorder) 07/29/2019   BMI 34.0-34.9,adult 07/29/2019   Low libido 07/29/2019    Allergies:  Allergies  Allergen Reactions   Latex Rash   Medications:  Current Outpatient Medications:    amoxicillin-clavulanate (AUGMENTIN) 875-125 MG tablet, Take 1 tablet by mouth 2 (two) times daily., Disp: 14 tablet, Rfl: 0   buPROPion (WELLBUTRIN SR) 150 MG 12 hr tablet, Take 1 tablet (150 mg total) by mouth in the morning., Disp: 30 tablet, Rfl: 5   FLUoxetine (PROZAC) 20 MG capsule, Take  1 capsule (20 mg total) by mouth daily., Disp: 30 capsule, Rfl: 3  Observations/Objective: Patient is well-developed, well-nourished in no acute distress.  Resting comfortably  at home.  Head is normocephalic, atraumatic.  No labored breathing. Speech is clear and coherent with logical content.  Patient is alert and oriented at baseline.   Assessment and Plan: 1. Acute sinusitis, recurrence not specified, unspecified location - amoxicillin-clavulanate (AUGMENTIN) 875-125 MG tablet; Take 1 tablet by mouth 2 (two) times daily.  Dispense: 14 tablet;  Refill: 0 - Take meds as prescribed - Use a cool mist humidifier  -Use saline nose sprays frequently -Force fluids -For any cough or congestion  Use plain Mucinex- regular strength or max strength is fine -For fever or aces or pains- take tylenol or ibuprofen. -Throat lozenges if help Follow up if symptoms worsen or do not improve  Follow Up Instructions: I discussed the assessment and treatment plan with the patient. The patient was provided an opportunity to ask questions and all were answered. The patient agreed with the plan and demonstrated an understanding of the instructions.  A copy of instructions were sent to the patient via MyChart.  The patient was advised to call back or seek an in-person evaluation if the symptoms worsen or if the condition fails to improve as anticipated.  Time:  I spent 10 minutes with the patient via telehealth technology discussing the above problems/concerns.    Evelina Dun, FNP

## 2021-03-03 ENCOUNTER — Other Ambulatory Visit: Payer: Self-pay | Admitting: Family Medicine

## 2021-03-03 DIAGNOSIS — Z720 Tobacco use: Secondary | ICD-10-CM

## 2021-03-03 DIAGNOSIS — F339 Major depressive disorder, recurrent, unspecified: Secondary | ICD-10-CM

## 2021-03-03 DIAGNOSIS — F411 Generalized anxiety disorder: Secondary | ICD-10-CM

## 2021-03-03 DIAGNOSIS — R6882 Decreased libido: Secondary | ICD-10-CM

## 2021-03-05 ENCOUNTER — Telehealth: Payer: Self-pay | Admitting: Family Medicine

## 2021-03-05 DIAGNOSIS — R6882 Decreased libido: Secondary | ICD-10-CM

## 2021-03-05 DIAGNOSIS — Z720 Tobacco use: Secondary | ICD-10-CM

## 2021-03-05 DIAGNOSIS — F339 Major depressive disorder, recurrent, unspecified: Secondary | ICD-10-CM

## 2021-03-05 MED ORDER — BUPROPION HCL ER (SR) 150 MG PO TB12: 150.0000 mg | ORAL_TABLET | Freq: Every morning | ORAL | 5 refills | Status: DC

## 2021-03-05 NOTE — Telephone Encounter (Signed)
  Prescription Request  03/05/2021  What is the name of the medication or equipment? buPROPion (WELLBUTRIN SR) 150 MG 12 hr tablet  FLUoxetine (PROZAC) 20 MG capsule  Have you contacted your pharmacy to request a refill? (if applicable) yes refused pt ntbs. Pt scheduled for pcp next available 04/09/2021. Can a refill be called in till apt.  Which pharmacy would you like this sent to? [facility] [place]   Patient notified that their request is being sent to the clinical staff for review and that they should receive a response within 2 business days.

## 2021-04-09 ENCOUNTER — Encounter: Payer: Self-pay | Admitting: Family Medicine

## 2021-04-09 ENCOUNTER — Ambulatory Visit: Payer: BC Managed Care – PPO | Admitting: Family Medicine

## 2021-04-09 ENCOUNTER — Other Ambulatory Visit: Payer: Self-pay

## 2021-04-09 VITALS — BP 126/83 | HR 72 | Temp 97.8°F | Ht 72.0 in | Wt 240.4 lb

## 2021-04-09 DIAGNOSIS — F339 Major depressive disorder, recurrent, unspecified: Secondary | ICD-10-CM | POA: Diagnosis not present

## 2021-04-09 DIAGNOSIS — F411 Generalized anxiety disorder: Secondary | ICD-10-CM | POA: Diagnosis not present

## 2021-04-09 DIAGNOSIS — Z720 Tobacco use: Secondary | ICD-10-CM | POA: Diagnosis not present

## 2021-04-09 MED ORDER — BUPROPION HCL ER (SR) 150 MG PO TB12: 150.0000 mg | ORAL_TABLET | Freq: Every morning | ORAL | 12 refills | Status: DC

## 2021-04-09 MED ORDER — BUPROPION HCL ER (SR) 150 MG PO TB12: 150.0000 mg | ORAL_TABLET | Freq: Every morning | ORAL | 3 refills | Status: DC

## 2021-04-09 MED ORDER — FLUOXETINE HCL 20 MG PO CAPS: 20.0000 mg | ORAL_CAPSULE | Freq: Every day | ORAL | 12 refills | Status: DC

## 2021-04-09 NOTE — Patient Instructions (Signed)
Don't hesitate to reach out if symptoms do not improve as you get back on the fluoxetine.

## 2021-04-09 NOTE — Progress Notes (Signed)
Subjective: CC: Anxiety and depression PCP: Janora Norlander, DO [id number] Keith Ross is a 30 y.o. male presenting to clinic today for:  1.  Anxiety and depression; tobacco use Patient reports that his symptoms were exacerbated recently after he and his spouse went through some financial troubles.  At the also were having some issues but those have since gotten better.  He resumed use of Wellbutrin this July and he really has not seen total resolution of symptoms again.  He admits to mood lability, easy irritation.  He has seen a positive impact on his tobacco use and continues to try and reduce that as able.  No change in breathing, exercise tolerance.  He is seeing counseling services in [place]   ROS: Per HPI  Allergies  Allergen Reactions   Latex Rash   Nickel Rash   Past Medical History:  Diagnosis Date   Allergy    Anxiety    Depression     Current Outpatient Medications:    buPROPion (WELLBUTRIN SR) 150 MG 12 hr tablet, Take 1 tablet (150 mg total) by mouth in the morning., Disp: 30 tablet, Rfl: 12   FLUoxetine (PROZAC) 20 MG capsule, Take 1 capsule (20 mg total) by mouth daily., Disp: 30 capsule, Rfl: 12 Social History   Socioeconomic History   Marital status: Married    Spouse name: [name]   Number of children: 1   Years of education: Not on file   Highest education level: Not on file  Occupational History   Not on file  Tobacco Use   Smoking status: Every Day    Packs/day: 1.00    Years: 10.00    Pack years: 10.00    Types: Cigarettes   Smokeless tobacco: Never  Vaping Use   Vaping Use: Never used  Substance and Sexual Activity   Alcohol use: No    Comment: occ   Drug use: No   Sexual activity: Not on file  Other Topics Concern   Not on file  Social History Narrative   Not on file   Social Determinants of Health   Financial Resource Strain: Not on file  Food Insecurity: Not on file  Transportation Needs: Not on file  Physical Activity:  Not on file  Stress: Not on file  Social Connections: Not on file  Intimate Partner Violence: Not on file   Family History  Problem Relation Age of Onset   Depression Mother    Anxiety disorder Mother    Hypertension Father    Diabetes Maternal Grandmother    Hypertension Maternal Grandmother     Objective: Office vital signs reviewed. BP 126/83   Pulse 72   Temp 97.8 F (36.6 C)   Ht 6' (1.829 m)   Wt 240 lb 6.4 oz (109 kg)   SpO2 99%   BMI 32.60 kg/m   Physical Examination:  General: Awake, alert, well nourished, No acute distress HEENT: Normal; sclera white Cardio: regular rate and rhythm, S1S2 heard, no murmurs appreciated Pulm: clear to auscultation bilaterally, no wheezes, rhonchi or rales; normal work of breathing on room air Psych: Mood stable, speech normal, affect appropriate.  Patient is pleasant and interactive.  Good eye contact  Depression screen [facility] 2/9 04/09/2021 01/07/2020 12/08/2019  Decreased Interest '2 1 2  '$ Down, Depressed, Hopeless 2 0 2  PHQ - 2 Score '4 1 4  '$ Altered sleeping 1 0 0  Tired, decreased energy '3 1 2  '$ Change in appetite 2 0 0  Feeling  bad or failure about yourself  1 0 2  Trouble concentrating 2 0 3  Moving slowly or fidgety/restless 1 0 0  Suicidal thoughts 1 0 0  PHQ-9 Score '15 2 11  '$ Difficult doing work/chores Somewhat difficult Not difficult at all Somewhat difficult   GAD 7 : Generalized Anxiety Score 04/09/2021 01/07/2020 12/08/2019 08/30/2019  Nervous, Anxious, on Edge '1 1 2 '$ 0  Control/stop worrying 1 0 2 0  Worry too much - different things 1 0 2 1  Trouble relaxing 1 0 2 0  Restless 1 0 2 1  Easily annoyed or irritable '3 1 2 '$ 0  Afraid - awful might happen 1 0 2 1  Total GAD 7 Score '9 2 14 3  '$ Anxiety Difficulty Somewhat difficult Not difficult at all Somewhat difficult Somewhat difficult     Assessment/ Plan: 30 y.o. male   Recurrent depression ([place]) - Plan: FLUoxetine (PROZAC) 20 MG capsule, buPROPion (WELLBUTRIN SR)  150 MG 12 hr tablet, DISCONTINUED: buPROPion (WELLBUTRIN SR) 150 MG 12 hr tablet  GAD (generalized anxiety disorder) - Plan: FLUoxetine (PROZAC) 20 MG capsule  Tobacco use - Plan: buPROPion (WELLBUTRIN SR) 150 MG 12 hr tablet, DISCONTINUED: buPROPion (WELLBUTRIN SR) 150 MG 12 hr tablet  Anxiety depression are not well controlled.  Continue with counseling services.  I added fluoxetine back to his regimen.  Continue Wellbutrin.  Encouraged him to follow-up with me if symptoms are not controlled back on the regimen that was previously effective.  Reinforced smoking cessation.  Follow-up in 1 year for annual physical exam with fasting labs  No orders of the defined types were placed in this encounter.  Meds ordered this encounter  Medications   DISCONTD: buPROPion (WELLBUTRIN SR) 150 MG 12 hr tablet    Sig: Take 1 tablet (150 mg total) by mouth in the morning.    Dispense:  90 tablet    Refill:  3   FLUoxetine (PROZAC) 20 MG capsule    Sig: Take 1 capsule (20 mg total) by mouth daily.    Dispense:  30 capsule    Refill:  12   buPROPion (WELLBUTRIN SR) 150 MG 12 hr tablet    Sig: Take 1 tablet (150 mg total) by mouth in the morning.    Dispense:  30 tablet    Refill:  [facility], [place] [phone]

## 2022-02-08 ENCOUNTER — Telehealth: Payer: BC Managed Care – PPO | Admitting: Nurse Practitioner

## 2022-02-08 DIAGNOSIS — J40 Bronchitis, not specified as acute or chronic: Secondary | ICD-10-CM

## 2022-02-09 MED ORDER — BENZONATATE 100 MG PO CAPS: 100.0000 mg | ORAL_CAPSULE | Freq: Three times a day (TID) | ORAL | 0 refills | Status: DC | PRN

## 2022-02-09 MED ORDER — AZITHROMYCIN 250 MG PO TABS: ORAL_TABLET | ORAL | 0 refills | Status: AC

## 2022-03-18 ENCOUNTER — Encounter: Payer: Self-pay | Admitting: *Deleted

## 2022-04-10 ENCOUNTER — Encounter: Payer: BC Managed Care – PPO | Admitting: Family Medicine

## 2022-04-16 ENCOUNTER — Other Ambulatory Visit: Payer: Self-pay | Admitting: Family Medicine

## 2022-04-16 ENCOUNTER — Ambulatory Visit (INDEPENDENT_AMBULATORY_CARE_PROVIDER_SITE_OTHER): Payer: BC Managed Care – PPO

## 2022-04-16 ENCOUNTER — Encounter: Payer: Self-pay | Admitting: Family Medicine

## 2022-04-16 ENCOUNTER — Ambulatory Visit (INDEPENDENT_AMBULATORY_CARE_PROVIDER_SITE_OTHER): Payer: BC Managed Care – PPO | Admitting: Family Medicine

## 2022-04-16 VITALS — BP 129/87 | HR 81 | Temp 98.4°F | Ht 72.0 in | Wt 267.0 lb

## 2022-04-16 DIAGNOSIS — Z87891 Personal history of nicotine dependence: Secondary | ICD-10-CM

## 2022-04-16 DIAGNOSIS — R059 Cough, unspecified: Secondary | ICD-10-CM | POA: Diagnosis not present

## 2022-04-16 DIAGNOSIS — R058 Other specified cough: Secondary | ICD-10-CM | POA: Diagnosis not present

## 2022-04-16 DIAGNOSIS — F411 Generalized anxiety disorder: Secondary | ICD-10-CM

## 2022-04-16 DIAGNOSIS — F339 Major depressive disorder, recurrent, unspecified: Secondary | ICD-10-CM

## 2022-04-16 DIAGNOSIS — L989 Disorder of the skin and subcutaneous tissue, unspecified: Secondary | ICD-10-CM | POA: Diagnosis not present

## 2022-04-16 DIAGNOSIS — Z0000 Encounter for general adult medical examination without abnormal findings: Secondary | ICD-10-CM

## 2022-04-16 DIAGNOSIS — Z0001 Encounter for general adult medical examination with abnormal findings: Secondary | ICD-10-CM

## 2022-04-16 DIAGNOSIS — E669 Obesity, unspecified: Secondary | ICD-10-CM | POA: Diagnosis not present

## 2022-04-16 DIAGNOSIS — Z720 Tobacco use: Secondary | ICD-10-CM

## 2022-04-16 MED ORDER — BUPROPION HCL ER (SR) 150 MG PO TB12: 150.0000 mg | ORAL_TABLET | Freq: Every morning | ORAL | 3 refills | Status: DC

## 2022-04-16 MED ORDER — FLUOXETINE HCL 40 MG PO CAPS: 40.0000 mg | ORAL_CAPSULE | Freq: Every day | ORAL | 3 refills | Status: DC

## 2022-04-16 MED ORDER — PANTOPRAZOLE SODIUM 40 MG PO TBEC: 40.0000 mg | DELAYED_RELEASE_TABLET | Freq: Every day | ORAL | 0 refills | Status: DC

## 2022-04-16 NOTE — Progress Notes (Signed)
Keith Ross is a 31 y.o. male presents to office today for annual physical exam examination.    Concerns today include: 1.  Skin lesion His wife is concerned about a skin lesion on the right shoulder.  Does not report any significant growth but it looked a little unusual.  2.  Cough Patient with ongoing productive cough that got worse after respiratory infection 2 months ago.  He was treated with a Z-Pak.  He reports the cough is typically clear.  No hemoptysis.  He has been off cigarettes for almost a year now and off vape for over a month.  He unfortunately has had some weight gain as a result of stopping smoking.  See below  3.  Weight gain Patient reports weight gain over the last month since stopping smoking.  He admits that he is not as physically active at work because work has been slow.  He does frequently snack and drink sodas.  He does not exercise but admits that he has a an hour at lunchtime that he could do some walking.  He reports as a result that his energy has been lower than normal.  Marital status: married, Substance use: former smoker Diet: high fat/ sugar, Exercise: none Last eye exam: UTD Last dental exam: UTD Last colonoscopy: n/a Refills needed today: All Immunizations needed: Declines COVID vaccination Immunization History  Administered Date(s) Administered   Tdap 06/30/2015     Past Medical History:  Diagnosis Date   Allergy    Anxiety    Depression    Social History   Socioeconomic History   Marital status: Married    Spouse name: [name]   Number of children: 1   Years of education: Not on file   Highest education level: Not on file  Occupational History   Not on file  Tobacco Use   Smoking status: Every Day    Packs/day: 1.00    Years: 10.00    Total pack years: 10.00    Types: Cigarettes   Smokeless tobacco: Never  Vaping Use   Vaping Use: Never used  Substance and Sexual Activity   Alcohol use: No    Comment: occ   Drug use:  No   Sexual activity: Not on file  Other Topics Concern   Not on file  Social History Narrative   Not on file   Social Determinants of Health   Financial Resource Strain: Not on file  Food Insecurity: Not on file  Transportation Needs: Not on file  Physical Activity: Not on file  Stress: Not on file  Social Connections: Not on file  Intimate Partner Violence: Not on file   No past surgical history on file. Family History  Problem Relation Age of Onset   Depression Mother    Anxiety disorder Mother    Hypertension Father    Diabetes Maternal Grandmother    Hypertension Maternal Grandmother     Current Outpatient Medications:    pantoprazole (PROTONIX) 40 MG tablet, Take 1 tablet (40 mg total) by mouth daily., Disp: 90 tablet, Rfl: 0   buPROPion (WELLBUTRIN SR) 150 MG 12 hr tablet, Take 1 tablet (150 mg total) by mouth in the morning., Disp: 90 tablet, Rfl: 3   FLUoxetine (PROZAC) 40 MG capsule, Take 1 capsule (40 mg total) by mouth daily., Disp: 90 capsule, Rfl: 3  Allergies  Allergen Reactions   Latex Rash   Nickel Rash     ROS: Review of Systems Pertinent items noted in  HPI and remainder of comprehensive ROS otherwise negative.    Physical exam BP 129/87   Pulse 81   Temp 98.4 F (36.9 C)   Ht 6' (1.829 m)   Wt 267 lb (121.1 kg)   SpO2 99%   BMI 36.21 kg/m  General appearance: alert, cooperative, appears stated age, no distress, and moderately obese Head: Normocephalic, without obvious abnormality, atraumatic Eyes: negative findings: lids and lashes normal, conjunctivae and sclerae normal, corneas clear, and pupils equal, round, reactive to light and accomodation Ears: normal TM's and external ear canals both ears Nose: Nares normal. Septum midline. Mucosa normal. No drainage or sinus tenderness. Throat: lips, mucosa, and tongue normal; teeth and gums normal Neck: no adenopathy, supple, symmetrical, trachea midline, and thyroid not enlarged, symmetric, no  tenderness/mass/nodules Back: symmetric, no curvature. ROM normal. No CVA tenderness. Lungs: clear to auscultation bilaterally Chest wall: no tenderness Heart: regular rate and rhythm, S1, S2 normal, no murmur, click, rub or gallop Abdomen:  Soft, nondistended.  Mild epigastric tenderness present.  No rebound or guarding.  No hepatosplenomegaly or abdominal masses appreciated Extremities: extremities normal, atraumatic, no cyanosis or edema Pulses: 2+ and symmetric Skin:  5 mm x 5 mm pearlescent and slightly nodular appearing well-circumscribed lesion on the right anterior shoulder.  There are a few hairs surrounding but nothing growing centrally.  He has a couple of skin tags beneath the right breast area Lymph nodes: Cervical, supraclavicular, and axillary nodes normal. Neurologic: Grossly normal Psych: Mood stable, speech normal.  Good eye contact.     04/09/2021    2:42 PM 01/07/2020   11:41 AM 12/08/2019    8:33 AM  Depression screen PHQ 2/9  Decreased Interest _0 Down, Depressed, Hopeless 2 0 2  PHQ - 2 Score _1 Altered sleeping 1 0 0  Tired, decreased energy _2 Change in appetite 2 0 0  Feeling bad or failure about yourself  1 0 2  Trouble concentrating 2 0 3  Moving slowly or fidgety/restless 1 0 0  Suicidal thoughts 1 0 0  PHQ-9 Score _3 Difficult doing work/chores Somewhat difficult Not difficult at all Somewhat difficult      04/09/2021    2:42 PM 01/07/2020   11:42 AM 12/08/2019    8:34 AM 08/30/2019   11:57 AM  GAD 7 : Generalized Anxiety Score  Nervous, Anxious, on Edge _4 0  Control/stop worrying 1 0 2 0  Worry too much - different things 1 0 2 1  Trouble relaxing 1 0 2 0  Restless 1 0 2 1  Easily annoyed or irritable _5 0  Afraid - awful might happen 1 0 2 1  Total GAD 7 Score _6 Anxiety Difficulty Somewhat difficult Not difficult at all Somewhat difficult Somewhat difficult    Assessment/ Plan: [name] here for annual  physical exam.   Annual physical exam  Obesity (BMI 35.0-39.9 without comorbidity) - Plan: LDL Cholesterol, Direct, Bayer DCA Hb A1c Waived  Recurrent depression (H[facility]) - Plan: CMP14+EGFR, TSH, buPROPion (WELLBUTRIN SR) 150 MG 12 hr tablet, FLUoxetine (PROZAC) 40 MG capsule  GAD (generalized anxiety disorder) - Plan: CMP14+EGFR, TSH, FLUoxetine (PROZAC) 40 MG capsule  Former smoker - Plan: CBC, buPROPion (WELLBUTRIN SR) 150 MG 12 hr tablet, DG Chest 2 View  Cough present for greater than 3 weeks - Plan: pantoprazole (PROTONIX) 40 MG tablet, DG Chest 2  View  Skin lesion of right upper extremity  Check nonfasting labs.  Unfortunately his had some weight gain and now his BMI has gone into 35 range.  We discussed elimination of sodas and walking during his lunch break in order to lose weight.  I am certainly glad to start pharmacologic but I would like him to try lifestyle modifications first.  I gave him information on how to check insurance coverage of the pharmacologic's.  No apparent contraindications to use  Depression is not well controlled but much of this surrounds his weight gain.  We can trial advanced dose of Prozac to see if that makes any difference   He has had a cough its been present for greater than 3 weeks.  His lung exam was totally unremarkable.  However, since he is a former smoker I did obtain chest x-ray for further evaluation.  I am more leaning towards this being gastro mediated.  He had some mild epigastric tenderness on exam.  Trial of PPI  The skin lesion of concern on his right shoulder today appears to be consistent with a basal cell carcinoma.  I offered biopsy/excision of this but patient declined and would like to hold off until our 44-monthfollow-up.  Counseled on healthy lifestyle choices, including diet (rich in fruits, vegetables and lean meats and low in salt and simple carbohydrates) and exercise (at least 30 minutes of moderate physical activity daily).     Ashly M. GLajuana Ripple DO

## 2022-04-16 NOTE — Patient Instructions (Signed)
You had labs performed today.  You will be contacted with the results of the labs once they are available, usually in the next 3 business days for routine lab work.  If you have an active my chart account, they will be released to your MyChart.  If you prefer to have these labs released to you via telephone, please let us know.  Trial of Protonix for cough.  Your lungs were clear but since you are a former smoker, I obtained a chest xray.  Will call with formal findings once available. Prozac increased to '40mg'$ .  Spot on shoulder is concerning for basal cell carcinoma.  I offered to biopsy but I think it is ok to recheck in 2 months and remove at that time. We talked about getting rid of soda and walking during your lunch breaks. If you continue to struggle with weight loss, I am open to prescribing Wegovy or Saxenda if your insurance will pay for this.  You can check with your insurance directly for coverage OR you can call below.  Here is a guide to help [place] find out which weight loss medications will be covered by your insurance plan.  Please check out this web site  [employer] and follow the 3 simple steps.   There is also a phone number you can call if you do not have access to the Internet. [phone] (Monday- Friday 8am-8pm)  [employer] provides coverage information for more than 80% of the inquiries submitted!!  Basal Cell Carcinoma Basal cell carcinoma is the most common form of skin cancer. It begins in the basal cells, which are at the bottom of the outer skin layer (epidermis). Basal cell carcinoma can often be cured. It rarely spreads to other areas of the body (metastasizes). It may come back at the same location (recur), but it can be treated again if this happens. Basal cell carcinoma occurs most often on parts of the body that are frequently exposed to the sun, such as: Parts of the head, including the scalp or face. Ears. Neck. Arms or legs. Backs of the hands. What are  the causes? This condition is usually caused by exposure to ultraviolet (UV) light. UV light may come from the sun or from tanning beds. Other causes include: Exposure to arsenic, a highly poisonous metal. Exposure to high-energy X-rays (radiation). Exposure to toxic tars and oils. Certain genetic conditions, such as a condition that makes a person sensitive to sunlight. What increases the risk? You are more likely to develop this condition if: You are older than 30 years of age. You have: Fair skin (light complexion), blond or red hair, or blue, green, or gray eye color. Childhood freckling. Had repeated sunburns or sun exposure over long periods of time, especially during childhood. A weakened body defense system (immune system). Been exposed to certain chemicals, such as tar, soot, and arsenic. Chronic inflammatory conditions or infections. A family or personal history of basal cell carcinoma. You use tanning beds. What are the signs or symptoms? The main symptom of this condition is a growth or lesion on the skin. The shape and color of the growth or lesion may vary. The main types include: An open sore that may remain open for three weeks or longer. The sore may bleed or crust. This type of lesion can be an early sign of basal cell carcinoma. Basal cell carcinoma often shows up as a sore that does not heal. A reddish area that may crust, itch, or cause  discomfort. This may occur on areas that are exposed to the sun. These patches might be easier to feel than to see. A shiny or clear bump that is red, white, or pink. In people who have dark hair, the bump is often tan, black, or brown. These bumps can look like moles. A pink growth with a raised border. The growth will have a crusted and indented area in the center. Small blood vessels may appear on the surface of the growth as it gets bigger. A scar-like area that looks like shiny, stretched skin. The area may be white, yellow, or waxy.  It often has irregular borders. This may be a sign of more aggressive basal cell carcinoma. How is this diagnosed? This condition may be diagnosed with: A physical exam. Removal of a tissue sample to be examined under a microscope (biopsy). How is this treated? Treatment for this condition involves removing the cancerous tissue. The method that is used for this depends on the type, size, location, and number of tumors. Possible treatments include: Surgery, such as: Mohs surgery. In this procedure, the cancerous skin cells are removed layer by layer until all of the tumor has been removed. Surgical removal (excision) of the tumor. This involves removing the entire tumor and a small amount of normal skin that surrounds it. Cryosurgery. This involves freezing the tumor with liquid nitrogen. Plastic surgery. The tumor is removed, and healthy skin from another part of the body is used to cover the wound. This may be done for large tumors that are in areas where it is not possible to stretch the nearby skin to sew the edges of the wound together. Therapies or treatments, such as: Radiation. This may be used for tumors on the face. Photodynamic therapy. A chemical cream is applied to the skin, and light exposure is used to activate the chemical. Electrodesiccation and curettage. This involves alternately scraping and burning the tumor while using an electric current to control bleeding. Chemical treatments, such as imiquimod cream and interferon injections. These may be used to remove superficial tumors with minimal scarring. Follow these instructions at home: Avoid direct exposure to the sun. Do self-exams as told by your health care provider. Look for new spots or changes in your skin. Keep all follow-up visits. This is important. How is this prevented?  Avoid the sun when it is the strongest. This is usually between 10 a.m. and 4 p.m. When you are out in the sun, use a sunscreen that has a sun  protection factor (SPF) of at least 24. Apply sunscreen at least 30 minutes before exposure to the sun. Reapply sunscreen every 2-4 hours while you are outside. Also reapply it after swimming and after excessive sweating. Always wear hats, protective clothing, and UV-blocking sunglasses when you are outdoors. Do not use tanning beds. Contact a health care provider if: You notice any new spots or any changes in your skin. You have had a basal cell carcinoma tumor removed, and you notice a new growth in the same location. Get help right away if: You have a spot that is sore and does not heal. You have a spot that bleeds easily. Summary Basal cell carcinoma is the most common form of skin cancer. It begins in the bottom of the outer skin layer (epidermis). Basal cell carcinoma can almost always be cured. This condition is usually caused by exposure to ultraviolet (UV) light. It mostly affects the face, scalp, neck, ears, arms, legs, or backs of the hands.  The main symptom of this condition is a growth or lesion on the skin that can vary in shape and color. You can prevent this cancer by avoiding direct exposure to the sun, applying sunscreen of at least 30 SPF, and wearing protective clothing. Apply sunscreen 30 minutes before you go out into the sun, and reapply every 2-4 hours while you are outside. This information is not intended to replace advice given to you by your health care provider. Make sure you discuss any questions you have with your health care provider. Document Revised: 12/07/2020 Document Reviewed: 12/07/2020 Elsevier Patient Education  [employer].

## 2022-04-17 ENCOUNTER — Other Ambulatory Visit: Payer: Self-pay | Admitting: *Deleted

## 2022-04-17 DIAGNOSIS — R7989 Other specified abnormal findings of blood chemistry: Secondary | ICD-10-CM

## 2022-04-17 DIAGNOSIS — E7800 Pure hypercholesterolemia, unspecified: Secondary | ICD-10-CM

## 2022-04-17 DIAGNOSIS — E669 Obesity, unspecified: Secondary | ICD-10-CM

## 2022-04-17 LAB — TSH: TSH: 3.27 u[IU]/mL (ref 0.450–4.500)

## 2022-04-17 LAB — CMP14+EGFR
ALT: 46 IU/L — ABNORMAL HIGH (ref 0–44)
AST: 24 IU/L (ref 0–40)
Albumin/Globulin Ratio: 1.9 (ref 1.2–2.2)
Albumin: 4.8 g/dL (ref 4.3–5.2)
Alkaline Phosphatase: 90 IU/L (ref 44–121)
BUN/Creatinine Ratio: 13 (ref 9–20)
BUN: 13 mg/dL (ref 6–20)
Bilirubin Total: 0.3 mg/dL (ref 0.0–1.2)
CO2: 25 mmol/L (ref 20–29)
Calcium: 10 mg/dL (ref 8.7–10.2)
Chloride: 99 mmol/L (ref 96–106)
Creatinine, Ser: 0.97 mg/dL (ref 0.76–1.27)
Globulin, Total: 2.5 g/dL (ref 1.5–4.5)
Glucose: 96 mg/dL (ref 70–99)
Potassium: 4.8 mmol/L (ref 3.5–5.2)
Sodium: 140 mmol/L (ref 134–144)
Total Protein: 7.3 g/dL (ref 6.0–8.5)
eGFR: 108 mL/min/{1.73_m2} (ref >59)

## 2022-04-17 LAB — LDL CHOLESTEROL, DIRECT: LDL Direct: 114 mg/dL — ABNORMAL HIGH (ref 0–99)

## 2022-04-17 LAB — BAYER DCA HB A1C WAIVED: HB A1C (BAYER DCA - WAIVED): 5.5 % (ref 4.8–5.6)

## 2022-04-17 LAB — CBC
Hematocrit: 51 % (ref 37.5–51.0)
Hemoglobin: 17.6 g/dL (ref 13.0–17.7)
MCH: 30.2 pg (ref 26.6–33.0)
MCHC: 34.5 g/dL (ref 31.5–35.7)
MCV: 88 fL (ref 79–97)
Platelets: 280 10*3/uL (ref 150–450)
RBC: 5.82 x10E6/uL — ABNORMAL HIGH (ref 4.14–5.80)
RDW: 13 % (ref 11.6–15.4)
WBC: 10 10*3/uL (ref 3.4–10.8)

## 2022-06-25 ENCOUNTER — Ambulatory Visit (INDEPENDENT_AMBULATORY_CARE_PROVIDER_SITE_OTHER): Payer: BC Managed Care – PPO | Admitting: Family Medicine

## 2022-06-25 ENCOUNTER — Other Ambulatory Visit (HOSPITAL_COMMUNITY)
Admission: RE | Admit: 2022-06-25 | Discharge: 2022-06-25 | Disposition: A | Discharge status: Home / Self Care | Payer: BC Managed Care – PPO | Source: Ambulatory Visit | Attending: Family Medicine | Admitting: Family Medicine

## 2022-06-25 ENCOUNTER — Encounter: Payer: Self-pay | Admitting: Family Medicine

## 2022-06-25 VITALS — BP 125/79 | HR 81 | Temp 98.1°F | Ht 72.0 in | Wt 256.8 lb

## 2022-06-25 DIAGNOSIS — D485 Neoplasm of uncertain behavior of skin: Secondary | ICD-10-CM | POA: Diagnosis not present

## 2022-06-25 DIAGNOSIS — L989 Disorder of the skin and subcutaneous tissue, unspecified: Secondary | ICD-10-CM | POA: Diagnosis not present

## 2022-06-25 DIAGNOSIS — D2361 Other benign neoplasm of skin of right upper limb, including shoulder: Secondary | ICD-10-CM | POA: Diagnosis not present

## 2022-06-25 NOTE — Patient Instructions (Signed)
Skin Biopsy A skin biopsy is a procedure to remove a sample of your skin so that it can be checked for any disease. You may need a skin biopsy if you have a skin disease or abnormal changes on your skin (lesion). Tell a health care provider about: Any allergies you have. All medicines you are taking, including vitamins, herbs, eye drops, creams, and over-the-counter medicines. Any problems you or family members have had with anesthetic medicines. Any bleeding problems you have. Any surgeries you have had. Any medical conditions you have. Whether you are pregnant or may be pregnant. What are the risks? Generally, this is a safe procedure. However, problems may occur, including: Bleeding. Infection. Scarring. Allergic reaction to anesthetics, surgical materials, or ointments. What happens before the procedure? Medicines Ask your health care provider about: Changing or stopping your regular medicines. This is especially important if you are taking diabetes medicines or blood thinners. Taking medicines such as aspirin and ibuprofen. These medicines can thin your blood. Do not take these medicines unless your health care provider tells you to take them. Taking over-the-counter medicines, vitamins, herbs, and supplements. General instructions Follow instructions from your health care provider about eating or drinking restrictions. Ask your health care provider: How your surgery site will be marked. What steps will be taken to help prevent infection. These steps may include: Removing hair at the surgery site. Washing skin with a germ-killing soap. Taking antibiotic medicine. Ask your health care provider if you will need someone to take you home from the hospital or clinic after the procedure. What happens during the procedure?  You may be given medicine to numb the area (local anesthetic). Your health care provider will take a sample using one of these steps, depending on the type of skin  problem that you have: Shave biopsy. Your health care provider will shave away layers of your skin lesion with a sharp blade. After shaving, a gel or ointment may be used to control bleeding. Punch biopsy. Your health care provider will use a tool to remove all or part of the lesion. This leaves a small hole about the width of a pencil eraser. The area may be covered with a gel or ointment. Excisional or incisional biopsy. Your health care provider will use a surgical blade to remove all or part of your lesion. Your skin biopsy site may be closed with stitches (sutures). A bandage (dressing) will be applied. The procedure may vary among health care providers and hospitals. What happens after the procedure? Your skin sample will be sent to a lab for tests. Your skin biopsy site will be watched to make sure that it stops bleeding. You will be given instructions on how to care for your biopsy site. It is up to you to get the results of your procedure. Ask your health care provider, or the department that is doing the procedure, when your results will be ready. Summary A skin biopsy is a procedure to remove a sample of your skin (lesion) so that it can be checked under a microscope. Tell a health care provider about your medical history and all medicines you are taking, including vitamins, herbs, eye drops, creams, and over-the-counter medicines. Before the procedure, ask your health care provider about changing or stopping your regular medicines. During the procedure, your health care provider will take a skin sample from the area where you have the skin problem. After the procedure, your skin sample will be sent to a laboratory for testing. This   information is not intended to replace advice given to you by your health care provider. Make sure you discuss any questions you have with your health care provider. Document Revised: 03/06/2021 Document Reviewed: 03/06/2021 Elsevier Patient Education   2023 Elsevier Inc.  

## 2022-06-25 NOTE — Progress Notes (Signed)
Subjective: CC: Here for biopsy of right shoulder lesion PCP: Janora Norlander, DO Keith Ross is a 31 y.o. male presenting to clinic today for:  1.  Denies any growth of the right shoulder lesion.  No pain.  No drainage.  Has appointment with dermatology but wanted to get this 1 removed.   ROS: Per HPI  Allergies  Allergen Reactions   Latex Rash   Nickel Rash   Past Medical History:  Diagnosis Date   Allergy    Anxiety    Depression     Current Outpatient Medications:    buPROPion (WELLBUTRIN SR) 150 MG 12 hr tablet, Take 1 tablet (150 mg total) by mouth in the morning., Disp: 90 tablet, Rfl: 3   FLUoxetine (PROZAC) 40 MG capsule, Take 1 capsule (40 mg total) by mouth daily., Disp: 90 capsule, Rfl: 3   pantoprazole (PROTONIX) 40 MG tablet, Take 1 tablet (40 mg total) by mouth daily., Disp: 90 tablet, Rfl: 0 Social History   Socioeconomic History   Marital status: Married    Spouse name: [name]   Number of children: 1   Years of education: Not on file   Highest education level: Not on file  Occupational History   Not on file  Tobacco Use   Smoking status: Every Day    Packs/day: 1.00    Years: 10.00    Total pack years: 10.00    Types: Cigarettes   Smokeless tobacco: Never  Vaping Use   Vaping Use: Never used  Substance and Sexual Activity   Alcohol use: No    Comment: occ   Drug use: No   Sexual activity: Not on file  Other Topics Concern   Not on file  Social History Narrative   Not on file   Social Determinants of Health   Financial Resource Strain: Not on file  Food Insecurity: Not on file  Transportation Needs: Not on file  Physical Activity: Not on file  Stress: Not on file  Social Connections: Not on file  Intimate Partner Violence: Not on file   Family History  Problem Relation Age of Onset   Depression Mother    Anxiety disorder Mother    Hypertension Father    Diabetes Maternal Grandmother    Hypertension Maternal  Grandmother     Objective: Office vital signs reviewed. BP 125/79   Pulse 81   Temp 98.1 F (36.7 C)   Ht 6' (1.829 m)   Wt 256 lb 12.8 oz (116.5 kg)   SpO2 97%   BMI 34.83 kg/m   Physical Examination:  Skin: 0.5cm x0.5cm nodular lesion on right shoulder  Skin Biopsy Procedure:  Risks and benefits of procedure were reviewed with the patient.  Written consent scanned into chart.  Area of concern located on right shoulder.  Area was cleaned with alcohol swabs x2.  Local anesthesia was achieved by injecting 0.75 cc of lidocaine 2% with epinephrine.  The area was then cleaned with ChloraPrep.  Skin blade was used to biopsy lesion.  1cc Estimated blood loss.  Hemostasis achieved with pressure and silver nitrate.  Area was cleaned and a clean bandage was applied.      Assessment/ Plan: 31 y.o. male   Skin lesion of right upper extremity - Plan: Surgical pathology  Suspect nodular vs basal cell carcinoma.  Had slight vasovagal reaction after excision so he was allowed to rest, given sugar and he felt better upon discharge.  Home care instructions reviewed with  the patient reasons for reevaluation discussed.  Has appoint with dermatology in February but we will try and expedite this if this comes back abnormal  No orders of the defined types were placed in this encounter.  No orders of the defined types were placed in this encounter.    Janora Norlander, DO [place] [phone]

## 2022-06-27 LAB — SURGICAL PATHOLOGY

## 2022-07-16 ENCOUNTER — Telehealth: Payer: BC Managed Care – PPO | Admitting: Physician Assistant

## 2022-07-16 DIAGNOSIS — J019 Acute sinusitis, unspecified: Secondary | ICD-10-CM | POA: Diagnosis not present

## 2022-07-16 DIAGNOSIS — B9689 Other specified bacterial agents as the cause of diseases classified elsewhere: Secondary | ICD-10-CM

## 2022-07-16 MED ORDER — AMOXICILLIN-POT CLAVULANATE 875-125 MG PO TABS: 1.0000 | ORAL_TABLET | Freq: Two times a day (BID) | ORAL | 0 refills | Status: DC

## 2022-07-16 NOTE — Progress Notes (Signed)
I have spent 5 minutes in review of e-visit questionnaire, review and updating patient chart, medical decision making and response to patient.   Tenee Wish Cody Westley Blass, PA-C    

## 2022-07-16 NOTE — Progress Notes (Signed)

## 2022-10-11 DIAGNOSIS — L814 Other melanin hyperpigmentation: Secondary | ICD-10-CM | POA: Diagnosis not present

## 2022-10-11 DIAGNOSIS — D225 Melanocytic nevi of trunk: Secondary | ICD-10-CM | POA: Diagnosis not present

## 2023-05-21 ENCOUNTER — Other Ambulatory Visit: Payer: Self-pay | Admitting: Family Medicine

## 2023-05-21 DIAGNOSIS — F411 Generalized anxiety disorder: Secondary | ICD-10-CM

## 2023-05-21 DIAGNOSIS — F339 Major depressive disorder, recurrent, unspecified: Secondary | ICD-10-CM

## 2023-05-22 ENCOUNTER — Encounter: Payer: Self-pay | Admitting: Family Medicine

## 2023-05-22 NOTE — Telephone Encounter (Signed)
[facility]MT[facility] TO SCHEDULE APPT LETTER MAILED 

## 2023-05-22 NOTE — Telephone Encounter (Signed)
[place] [employer] Last OV 04/18/23 NO RF sent to pharmacy since last OV greater than a year

## 2023-05-22 NOTE — Telephone Encounter (Signed)
Pt scheduled next available 09/01/2023

## 2023-05-22 NOTE — Addendum Note (Signed)
Addended by: Julious Payer D on: 05/22/2023 04:53 PM   Modules accepted: Orders

## 2023-05-26 MED ORDER — FLUOXETINE HCL 40 MG PO CAPS: 40.0000 mg | ORAL_CAPSULE | Freq: Every day | ORAL | 0 refills | Status: DC

## 2023-06-24 ENCOUNTER — Ambulatory Visit (INDEPENDENT_AMBULATORY_CARE_PROVIDER_SITE_OTHER): Payer: BC Managed Care – PPO

## 2023-06-24 ENCOUNTER — Ambulatory Visit (INDEPENDENT_AMBULATORY_CARE_PROVIDER_SITE_OTHER): Payer: BC Managed Care – PPO | Admitting: Podiatry

## 2023-06-24 DIAGNOSIS — M778 Other enthesopathies, not elsewhere classified: Secondary | ICD-10-CM | POA: Diagnosis not present

## 2023-06-24 DIAGNOSIS — M722 Plantar fascial fibromatosis: Secondary | ICD-10-CM

## 2023-06-24 DIAGNOSIS — L748 Other eccrine sweat disorders: Secondary | ICD-10-CM

## 2023-06-24 DIAGNOSIS — L400 Psoriasis vulgaris: Secondary | ICD-10-CM | POA: Diagnosis not present

## 2023-06-24 MED ORDER — CLOBETASOL PROPIONATE 0.05 % EX OINT: 1.0000 | TOPICAL_OINTMENT | Freq: Two times a day (BID) | CUTANEOUS | 0 refills | Status: DC

## 2023-06-24 MED ORDER — MELOXICAM 15 MG PO TABS: 15.0000 mg | ORAL_TABLET | Freq: Every day | ORAL | 3 refills | Status: DC

## 2023-06-24 NOTE — Patient Instructions (Signed)

## 2023-06-25 ENCOUNTER — Encounter: Payer: Self-pay | Admitting: Family Medicine

## 2023-06-25 ENCOUNTER — Ambulatory Visit (INDEPENDENT_AMBULATORY_CARE_PROVIDER_SITE_OTHER): Payer: BC Managed Care – PPO | Admitting: Family Medicine

## 2023-06-25 VITALS — BP 127/84 | HR 75 | Ht 72.0 in | Wt 278.0 lb

## 2023-06-25 DIAGNOSIS — B372 Candidiasis of skin and nail: Secondary | ICD-10-CM

## 2023-06-25 MED ORDER — NYSTATIN 100000 UNIT/GM EX CREA: 1.0000 | TOPICAL_CREAM | Freq: Two times a day (BID) | CUTANEOUS | 0 refills | Status: AC

## 2023-06-25 NOTE — Progress Notes (Signed)
   BP 127/84   Pulse 75   Ht 6' (1.829 m)   Wt 278 lb (126.1 kg)   SpO2 97%   BMI 37.70 kg/m    Subjective:   Patient ID: Keith Ross, male    DOB: [birth year], 32 y.o.   MRN: [id number]  HPI: Keith Ross is a 32 y.o. male presenting on 06/25/2023 for umbilicus bleeding   HPI Rash umbilicus Patient has noticed a rash and small amount of blood in his umbilicus and small rash.  He has a small amount of discharge.   Relevant past medical, surgical, family and social history reviewed and updated as indicated. Interim medical history since our last visit reviewed. Allergies and medications reviewed and updated.  Review of Systems  Skin:  Positive for rash. Negative for color change.  All other systems reviewed and are negative.   Per HPI unless specifically indicated above   Allergies as of 06/25/2023       Reactions   Latex Rash   Nickel Rash        Medication List        Accurate as of June 25, 2023  4:13 PM. If you have any questions, ask your nurse or doctor.          buPROPion 150 MG 12 hr tablet Commonly known as: Wellbutrin SR Take 1 tablet (150 mg total) by mouth in the morning.   clobetasol ointment 0.05 % Commonly known as: TEMOVATE Apply 1 Application topically 2 (two) times daily.   FLUoxetine 40 MG capsule Commonly known as: PROzac Take 1 capsule (40 mg total) by mouth daily.   meloxicam 15 MG tablet Commonly known as: Mobic Take 1 tablet (15 mg total) by mouth daily.   nystatin cream Commonly known as: MYCOSTATIN Apply 1 Application topically 2 (two) times daily for 14 days. Started by: Elige Radon Mukhtar Shams         Objective:   BP 127/84   Pulse 75   Ht 6' (1.829 m)   Wt 278 lb (126.1 kg)   SpO2 97%   BMI 37.70 kg/m   Wt Readings from Last 3 Encounters:  06/25/23 278 lb (126.1 kg)  06/25/22 256 lb 12.8 oz (116.5 kg)  04/16/22 267 lb (121.1 kg)    Physical Exam Vitals and nursing note reviewed.  Constitutional:       Appearance: Normal appearance.  Skin:    Findings: Rash (yeast dermatitis in umbilicus) present.  Neurological:     Mental Status: He is alert.       Assessment & Plan:   Problem List Items Addressed This Visit   None Visit Diagnoses     Yeast dermatitis    -  Primary   Relevant Medications   nystatin cream (MYCOSTATIN)       Yeast dermatitis, has appearance of it, recommended topical antifungal for 2 full weeks, can use cotton swabs to help get medication down inside the umbilicus. Follow up plan: Return if symptoms worsen or fail to improve.  Counseling provided for all of the vaccine components No orders of the defined types were placed in this encounter.   Arville Care, MD [facility] 06/25/2023, 4:13 PM

## 2023-06-26 NOTE — Progress Notes (Signed)
  Subjective:  Patient ID: Keith Ross, male    DOB: Dec 22, 1990,  MRN: [id number]  Chief Complaint  Patient presents with   Foot Pain    Left foot pain in heel. Also has rash on top of left foot. Pain started 2 months ago. Had it when he was younger but not this bad.    Discussed the use of AI scribe software for clinical note transcription with the patient, who gave verbal consent to proceed.  History of Present Illness   The patient, a Games developer, presents with heel pain, foot odor, and a rash on his left foot. He also reports calluses on both feet. The heel pain is located on the right foot and is described as shooting pain that sometimes radiates upwards. He has tried using heel support and soft cushion insoles to alleviate the pain, but with limited success.  The patient has noticed an odor from his feet, which is more pronounced at the end of the day. He has been using Lotrimin Ultra and another unspecified product to manage the odor. He has also noticed increased sweating in his feet recently, despite switching to breathable socks.  The rash is located on the left foot and sometimes appears on his hands and stomach. He suspects it may be psoriasis, but has not been medically diagnosed.  In addition to these issues, the patient has calluses on both feet.          Objective:    Physical Exam   EXTREMITIES: Palpable pulses, warm and well perfused foot with good capillary refill time. MUSCULOSKELETAL: Plantar fasciitis and a small heel spur observed. SKIN: Dry, scaling rash on the dorsal midfoot, consistent with plaque psoriasis.       No images are attached to the encounter.    Results   Procedure: Cortisone injection Description: Cold spray applied to heel. Injection administered with sharp pinch, burning, and pressure lasting about ten seconds. Informed Consent: Counseled about cold spray to numb skin. Explained that the injection will reduce inflammation.       Assessment:   1. Plantar fasciitis, left   2. Plaque psoriasis   3. Foot odor      Plan:  Patient was evaluated and treated and all questions answered.  Assessment and Plan    Plantar Fasciitis   He exhibits pain in the plantar heel and mid-substance of the plantar fascia without signs of systemic disease. We administered a cortisone injection in the heel to reduce inflammation and prescribed Meloxicam for daily use. Therapy exercises for calf muscle stretching and foot muscle strengthening were provided. A follow-up visit is scheduled in six weeks.  Psoriasis   He presents with a dry scaling rash on the dorsal midfoot, occasionally extending to the stomach and hands, with no previous medical diagnosis. We prescribed a topical steroid for the rash on the foot and other affected areas.  Foot Odor   He reports constant foot odor, worsening towards the end of the day, likely due to excessive sweating and prolonged use of work boots. We recommended Drysol spray or powder to control sweating and advised on footwear hygiene, including alternating between two pairs of boots, using breathable socks, changing socks midday, and using shoe drying devices or antibacterial spray. A follow-up visit is scheduled in six weeks.          Return in about 6 weeks (around 08/05/2023) for recheck plantar fasciitis.

## 2023-08-05 ENCOUNTER — Ambulatory Visit (INDEPENDENT_AMBULATORY_CARE_PROVIDER_SITE_OTHER): Payer: BC Managed Care – PPO | Admitting: Podiatry

## 2023-08-05 DIAGNOSIS — Z91199 Patient's noncompliance with other medical treatment and regimen due to unspecified reason: Secondary | ICD-10-CM

## 2023-08-06 NOTE — Progress Notes (Signed)
Patient was no-show for appointment today 

## 2023-08-23 ENCOUNTER — Telehealth: Payer: BC Managed Care – PPO | Admitting: Nurse Practitioner

## 2023-08-23 DIAGNOSIS — J069 Acute upper respiratory infection, unspecified: Secondary | ICD-10-CM | POA: Diagnosis not present

## 2023-08-23 MED ORDER — PROMETHAZINE-DM 6.25-15 MG/5ML PO SYRP: 5.0000 mL | ORAL_SOLUTION | Freq: Four times a day (QID) | ORAL | 0 refills | Status: DC | PRN

## 2023-08-23 MED ORDER — FLUTICASONE PROPIONATE 50 MCG/ACT NA SUSP: 2.0000 | Freq: Every day | NASAL | 6 refills | Status: AC

## 2023-08-23 NOTE — Progress Notes (Signed)
 I have spent 5 minutes in review of e-visit questionnaire, review and updating patient chart, medical decision making and response to patient.   Claiborne Rigg, NP

## 2023-08-23 NOTE — Progress Notes (Signed)
 E-Visit for Upper Respiratory Infection   We are sorry you are not feeling well.  Here is how we plan to help!  Providers prescribe antibiotics to treat infections caused by bacteria. Antibiotics are very powerful in treating bacterial infections when they are used properly. To maintain their effectiveness, they should be used only when necessary. Overuse of antibiotics has resulted in the development of superbugs that are resistant to treatment!    After careful review of your answers, I would not recommend an antibiotic for your condition.  Antibiotics are not effective against viruses and therefore should not be used to treat them. Common examples of infections caused by viruses include colds and flu  These symptoms usually persist for about 3 to 10 days, but can last up to 2 weeks.  It is important to know that upper respiratory infections do not cause serious illness or complications in most cases.    Based on what you have shared with me, it looks like you may have a viral upper respiratory infection.  Upper respiratory infections are caused by a large number of viruses; however, rhinovirus is the most common cause.   Symptoms vary from person to person, with common symptoms including sore throat, cough, fatigue or lack of energy and feeling of general discomfort.  A low-grade fever of up to 100.4 may present, but is often uncommon.  Symptoms vary however, and are closely related to a person's age or underlying illnesses.  The most common symptoms associated with an upper respiratory infection are nasal discharge or congestion, cough, sneezing, headache and pressure in the ears and face.   Upper respiratory infections can be transmitted from person to person, with the most common method of transmission being a person's hands.  The virus is able to live on the skin and can infect other persons for up to 2 hours after direct contact.  Also, these can be transmitted when someone coughs or sneezes;  thus, it is important to cover the mouth to reduce this risk.  To keep the spread of the illness at bay, good hand hygiene is very important.  This is an infection that is most likely caused by a virus. There are no specific treatments other than to help you with the symptoms until the infection runs its course.  We are sorry you are not feeling well.  Here is how we plan to help!   For nasal congestion, you may use an oral decongestants such as Mucinex D or if you have glaucoma or high blood pressure use plain Mucinex.  Saline nasal spray or nasal drops can help and can safely be used as often as needed for congestion.  For your congestion, I have prescribed Fluticasone  nasal spray one spray in each nostril twice a day  If you do not have a history of heart disease, hypertension, diabetes or thyroid  disease, prostate/bladder issues or glaucoma, you may also use Sudafed to treat nasal congestion.  It is highly recommended that you consult with a pharmacist or your primary care physician to ensure this medication is safe for you to take.     If you have a cough, you may use cough suppressants such as Delsym and Robitussin.  If you have glaucoma or high blood pressure, you can also use Coricidin HBP.   For cough I have prescribed for you a prescription cough syrup.    If you have a sore or scratchy throat, use a saltwater gargle-  to  teaspoon of salt dissolved  in a 4-ounce to 8-ounce glass of warm water.  Gargle the solution for approximately 15-30 seconds and then spit.  It is important not to swallow the solution.  You can also use throat lozenges/cough drops and Chloraseptic spray to help with throat pain or discomfort.  Warm or cold liquids can also be helpful in relieving throat pain.  For headache, pain or general discomfort, you can use Ibuprofen  or Tylenol  as directed.   Some authorities believe that zinc sprays or the use of Echinacea may shorten the course of your symptoms.   HOME  CARE Only take medications as instructed by your medical team. Be sure to drink plenty of fluids. Water is fine as well as fruit juices, sodas and electrolyte beverages. You may want to stay away from caffeine or alcohol. If you are nauseated, try taking small sips of liquids. How do you know if you are getting enough fluid? Your urine should be a pale yellow or almost colorless. Get rest. Taking a steamy shower or using a humidifier may help nasal congestion and ease sore throat pain. You can place a towel over your head and breathe in the steam from hot water coming from a faucet. Using a saline nasal spray works much the same way. Cough drops, hard candies and sore throat lozenges may ease your cough. Avoid close contacts especially the very young and the elderly Cover your mouth if you cough or sneeze Always remember to wash your hands.   GET HELP RIGHT AWAY IF: You develop worsening fever. If your symptoms do not improve within 10 days You develop yellow or green discharge from your nose over 3 days. You have coughing fits You develop a severe head ache or visual changes. You develop shortness of breath, difficulty breathing or start having chest pain Your symptoms persist after you have completed your treatment plan  MAKE SURE YOU  Understand these instructions. Will watch your condition. Will get help right away if you are not doing well or get worse.  Thank you for choosing an e-visit.  Your e-visit answers were reviewed by a board certified advanced clinical practitioner to complete your personal care plan. Depending upon the condition, your plan could have included both over the counter or prescription medications.  Please review your pharmacy choice. Make sure the pharmacy is open so you can pick up prescription now. If there is a problem, you may contact your provider through [employer] and have the prescription routed to another pharmacy.  Your safety is important to  us . If you have drug allergies check your prescription carefully.   For the next 24 hours you can use MyChart to ask questions about today's visit, request a non-urgent call back, or ask for a work or school excuse. You will get an email in the next two days asking about your experience. I hope that your e-visit has been valuable and will speed your recovery.

## 2023-09-01 ENCOUNTER — Ambulatory Visit (INDEPENDENT_AMBULATORY_CARE_PROVIDER_SITE_OTHER): Payer: BC Managed Care – PPO | Admitting: Family Medicine

## 2023-09-01 ENCOUNTER — Encounter: Payer: Self-pay | Admitting: Family Medicine

## 2023-09-01 VITALS — BP 130/83 | HR 70 | Temp 97.6°F | Ht 72.0 in | Wt 278.0 lb

## 2023-09-01 DIAGNOSIS — E7800 Pure hypercholesterolemia, unspecified: Secondary | ICD-10-CM

## 2023-09-01 DIAGNOSIS — Z6837 Body mass index (BMI) 37.0-37.9, adult: Secondary | ICD-10-CM

## 2023-09-01 DIAGNOSIS — F411 Generalized anxiety disorder: Secondary | ICD-10-CM | POA: Diagnosis not present

## 2023-09-01 DIAGNOSIS — R7989 Other specified abnormal findings of blood chemistry: Secondary | ICD-10-CM

## 2023-09-01 DIAGNOSIS — F339 Major depressive disorder, recurrent, unspecified: Secondary | ICD-10-CM

## 2023-09-01 LAB — BAYER DCA HB A1C WAIVED: HB A1C (BAYER DCA - WAIVED): 5.3 % (ref 4.8–5.6)

## 2023-09-01 MED ORDER — BUPROPION HCL ER (SR) 150 MG PO TB12: 150.0000 mg | ORAL_TABLET | Freq: Every morning | ORAL | 3 refills | Status: AC

## 2023-09-01 MED ORDER — FLUOXETINE HCL 40 MG PO CAPS: 40.0000 mg | ORAL_CAPSULE | Freq: Every day | ORAL | 3 refills | Status: DC

## 2023-09-01 NOTE — Patient Instructions (Signed)
 Shots for weight loss are: Scientist, research (physical sciences) and Zepbound  Pills for binge eating disorder is Vyvanse  See if your insurance covers any of these specifically for weight loss.

## 2023-09-01 NOTE — Progress Notes (Signed)
 Subjective: CC:GAD/Depression PCP: Jolinda Norene HERO, DO YEP:Keith Ross is a 33 y.o. male presenting to clinic today for:  1.  Anxiety and depression He reports that mood has been okay.  He lapsed in Wellbutrin  a few months ago.  He admits that he has been having some binge eating behaviors and emotional eating.  He has gained about 15 pounds since discontinuing the Wellbutrin  but 30 pounds total over the last year.  He does not report any specific stressors.  They have a few things going on in the family but nothing that he can specify.  Would like to go back on Wellbutrin  and would also like to discuss other therapies for weight loss.  He admits that he is not exercising and will be unlikely to be doing so   ROS: Per HPI  Allergies  Allergen Reactions   Latex Rash   Nickel Rash   Past Medical History:  Diagnosis Date   Allergy    Anxiety    Depression     Current Outpatient Medications:    fluticasone  (FLONASE ) 50 MCG/ACT nasal spray, Place 2 sprays into both nostrils daily., Disp: 16 g, Rfl: 6   buPROPion  (WELLBUTRIN  SR) 150 MG 12 hr tablet, Take 1 tablet (150 mg total) by mouth in the morning., Disp: 90 tablet, Rfl: 3   FLUoxetine  (PROZAC ) 40 MG capsule, Take 1 capsule (40 mg total) by mouth daily., Disp: 90 capsule, Rfl: 3 Social History   Socioeconomic History   Marital status: Married    Spouse name: Delon   Number of children: 1   Years of education: Not on file   Highest education level: Not on file  Occupational History   Not on file  Tobacco Use   Smoking status: Every Day    Current packs/day: 1.00    Average packs/day: 1 pack/day for 10.0 years (10.0 ttl pk-yrs)    Types: Cigarettes   Smokeless tobacco: Never  Vaping Use   Vaping status: Never Used  Substance and Sexual Activity   Alcohol use: No    Comment: occ   Drug use: No   Sexual activity: Not on file  Other Topics Concern   Not on file  Social History Narrative   Not on file    Social Drivers of Health   Financial Resource Strain: Not on file  Food Insecurity: Not on file  Transportation Needs: Not on file  Physical Activity: Not on file  Stress: Not on file  Social Connections: Not on file  Intimate Partner Violence: Not on file   Family History  Problem Relation Age of Onset   Depression Mother    Anxiety disorder Mother    Hypertension Father    Diabetes Maternal Grandmother    Hypertension Maternal Grandmother     Objective: Office vital signs reviewed. BP 130/83   Pulse 70   Temp 97.6 F (36.4 C)   Ht 6' (1.829 m)   Wt 278 lb (126.1 kg)   SpO2 98%   BMI 37.70 kg/m   Physical Examination:  General: Awake, alert, obese, No acute distress HEENT: sclera white, MMM Cardio: regular rate and rhythm, S1S2 heard, no murmurs appreciated Pulm: clear to auscultation bilaterally, no wheezes, rhonchi or rales; normal work of breathing on room air GI: soft, non-tender, non-distended, bowel sounds present x4, no hepatomegaly, no splenomegaly, no masses     09/01/2023    1:13 PM 06/25/2023    3:46 PM 06/25/2023    3:45 PM  Depression screen PHQ 2/9  Decreased Interest 1  0  Down, Depressed, Hopeless 0  1  PHQ - 2 Score 1  1  Altered sleeping 0 1   Tired, decreased energy 1 2   Change in appetite 3 3   Feeling bad or failure about yourself  0 0   Trouble concentrating 0 0   Moving slowly or fidgety/restless 0 0   Suicidal thoughts 1 0   PHQ-9 Score 6    Difficult doing work/chores Not difficult at all Not difficult at all       09/01/2023    1:12 PM 06/25/2023    3:45 PM 06/25/2022    4:04 PM 04/09/2021    2:42 PM  GAD 7 : Generalized Anxiety Score  Nervous, Anxious, on Edge 0 0 0 1  Control/stop worrying 0 0 0 1  Worry too much - different things 1 0 0 1  Trouble relaxing 0 0 0 1  Restless 1 0 0 1  Easily annoyed or irritable 2 0 0 3  Afraid - awful might happen 1 0 0 1  Total GAD 7 Score 5 0 0 9  Anxiety Difficulty  Not difficult at  all Not difficult at all Somewhat difficult   \Assessment/ Plan: 33 y.o. male   Recurrent depression (HCC) - Plan: FLUoxetine  (PROZAC ) 40 MG capsule, buPROPion  (WELLBUTRIN  SR) 150 MG 12 hr tablet  GAD (generalized anxiety disorder) - Plan: FLUoxetine  (PROZAC ) 40 MG capsule  Elevated LDL cholesterol level - Plan: CMP14+EGFR, Lipid Panel, TSH  Elevated LFTs - Plan: CMP14+EGFR, Lipid Panel, Hepatitis C antibody, CBC  Morbid obesity (HCC) - Plan: CMP14+EGFR, Lipid Panel, TSH, Bayer DCA Hb A1c Waived, VITAMIN D  25 Hydroxy (Vit-D Deficiency, Fractures)  BMI 37.0-37.9, adult  Renew fluoxetine , Wellbutrin .  I suspect he has had some level of of weight gain secondary to emotional eating.  He suggested that he has binge eating behaviors today and I do question if he may benefit from something like Vyvanse  for treatment.  He was certainly interested in weight loss medication but wanted to start with the Wellbutrin  first.  I will reexamine him in 3 months to determine need for adjunctive treatment.  We discussed various options including GLP, GIP, Wellbutrin  and Vyvanse  today.  He will inquire as to whether or not insurance covers on any of these for weight loss  Nonfasting labs collected today.  BMI is up to 37.7.   Norene CHRISTELLA Fielding, DO [facility] [phone]

## 2023-09-02 ENCOUNTER — Other Ambulatory Visit: Payer: Self-pay | Admitting: Family Medicine

## 2023-09-02 DIAGNOSIS — E559 Vitamin D deficiency, unspecified: Secondary | ICD-10-CM

## 2023-09-02 LAB — TSH: TSH: 4.77 u[IU]/mL — ABNORMAL HIGH (ref 0.450–4.500)

## 2023-09-02 LAB — LIPID PANEL
Chol/HDL Ratio: 6.2 {ratio} — ABNORMAL HIGH (ref 0.0–5.0)
Cholesterol, Total: 173 mg/dL (ref 100–199)
HDL: 28 mg/dL — ABNORMAL LOW (ref >39)
LDL Chol Calc (NIH): 99 mg/dL (ref 0–99)
Triglycerides: 269 mg/dL — ABNORMAL HIGH (ref 0–149)
VLDL Cholesterol Cal: 46 mg/dL — ABNORMAL HIGH (ref 5–40)

## 2023-09-02 LAB — CMP14+EGFR
ALT: 47 [IU]/L — ABNORMAL HIGH (ref 0–44)
AST: 17 [IU]/L (ref 0–40)
Albumin: 4.7 g/dL (ref 4.1–5.1)
Alkaline Phosphatase: 94 [IU]/L (ref 44–121)
BUN/Creatinine Ratio: 10 (ref 9–20)
BUN: 10 mg/dL (ref 6–20)
Bilirubin Total: 0.4 mg/dL (ref 0.0–1.2)
CO2: 24 mmol/L (ref 20–29)
Calcium: 9.3 mg/dL (ref 8.7–10.2)
Chloride: 100 mmol/L (ref 96–106)
Creatinine, Ser: 1.01 mg/dL (ref 0.76–1.27)
Globulin, Total: 2.2 g/dL (ref 1.5–4.5)
Glucose: 88 mg/dL (ref 70–99)
Potassium: 4.3 mmol/L (ref 3.5–5.2)
Sodium: 138 mmol/L (ref 134–144)
Total Protein: 6.9 g/dL (ref 6.0–8.5)
eGFR: 101 mL/min/{1.73_m2} (ref >59)

## 2023-09-02 LAB — CBC
Hematocrit: 47.1 % (ref 37.5–51.0)
Hemoglobin: 16 g/dL (ref 13.0–17.7)
MCH: 29.8 pg (ref 26.6–33.0)
MCHC: 34 g/dL (ref 31.5–35.7)
MCV: 88 fL (ref 79–97)
Platelets: 317 10*3/uL (ref 150–450)
RBC: 5.37 x10E6/uL (ref 4.14–5.80)
RDW: 12.7 % (ref 11.6–15.4)
WBC: 8.1 10*3/uL (ref 3.4–10.8)

## 2023-09-02 LAB — HEPATITIS C ANTIBODY: Hep C Virus Ab: NONREACTIVE

## 2023-09-02 LAB — VITAMIN D 25 HYDROXY (VIT D DEFICIENCY, FRACTURES): Vit D, 25-Hydroxy: 17.2 ng/mL — ABNORMAL LOW (ref 30.0–100.0)

## 2023-09-02 MED ORDER — VITAMIN D (ERGOCALCIFEROL) 1.25 MG (50000 UNIT) PO CAPS: 50000.0000 [IU] | ORAL_CAPSULE | ORAL | 0 refills | Status: AC

## 2023-09-03 ENCOUNTER — Encounter: Payer: Self-pay | Admitting: Family Medicine

## 2023-09-06 LAB — T4, FREE: Free T4: 1.25 ng/dL (ref 0.82–1.77)

## 2023-09-06 LAB — SPECIMEN STATUS REPORT

## 2023-09-08 DIAGNOSIS — S134XXA Sprain of ligaments of cervical spine, initial encounter: Secondary | ICD-10-CM | POA: Diagnosis not present

## 2023-09-08 DIAGNOSIS — S338XXA Sprain of other parts of lumbar spine and pelvis, initial encounter: Secondary | ICD-10-CM | POA: Diagnosis not present

## 2023-09-08 DIAGNOSIS — S233XXA Sprain of ligaments of thoracic spine, initial encounter: Secondary | ICD-10-CM | POA: Diagnosis not present

## 2023-09-17 DIAGNOSIS — S338XXA Sprain of other parts of lumbar spine and pelvis, initial encounter: Secondary | ICD-10-CM | POA: Diagnosis not present

## 2023-09-17 DIAGNOSIS — S134XXA Sprain of ligaments of cervical spine, initial encounter: Secondary | ICD-10-CM | POA: Diagnosis not present

## 2023-09-17 DIAGNOSIS — S233XXA Sprain of ligaments of thoracic spine, initial encounter: Secondary | ICD-10-CM | POA: Diagnosis not present

## 2023-10-01 DIAGNOSIS — S233XXA Sprain of ligaments of thoracic spine, initial encounter: Secondary | ICD-10-CM | POA: Diagnosis not present

## 2023-10-01 DIAGNOSIS — S338XXA Sprain of other parts of lumbar spine and pelvis, initial encounter: Secondary | ICD-10-CM | POA: Diagnosis not present

## 2023-10-01 DIAGNOSIS — S134XXA Sprain of ligaments of cervical spine, initial encounter: Secondary | ICD-10-CM | POA: Diagnosis not present

## 2023-10-15 DIAGNOSIS — S233XXA Sprain of ligaments of thoracic spine, initial encounter: Secondary | ICD-10-CM | POA: Diagnosis not present

## 2023-10-15 DIAGNOSIS — S134XXA Sprain of ligaments of cervical spine, initial encounter: Secondary | ICD-10-CM | POA: Diagnosis not present

## 2023-10-15 DIAGNOSIS — S338XXA Sprain of other parts of lumbar spine and pelvis, initial encounter: Secondary | ICD-10-CM | POA: Diagnosis not present

## 2023-12-10 ENCOUNTER — Ambulatory Visit: Payer: BC Managed Care – PPO | Admitting: Family Medicine

## 2023-12-11 ENCOUNTER — Encounter: Payer: Self-pay | Admitting: Family Medicine

## 2024-01-13 ENCOUNTER — Other Ambulatory Visit: Payer: Self-pay | Admitting: Family Medicine

## 2024-01-13 DIAGNOSIS — F411 Generalized anxiety disorder: Secondary | ICD-10-CM

## 2024-01-13 DIAGNOSIS — F339 Major depressive disorder, recurrent, unspecified: Secondary | ICD-10-CM

## 2024-02-04 ENCOUNTER — Encounter: Payer: Self-pay | Admitting: Family Medicine

## 2024-02-04 ENCOUNTER — Ambulatory Visit (INDEPENDENT_AMBULATORY_CARE_PROVIDER_SITE_OTHER): Admitting: Family Medicine

## 2024-02-04 VITALS — BP 128/85 | HR 100 | Temp 98.5°F | Ht 72.0 in | Wt 264.0 lb

## 2024-02-04 DIAGNOSIS — E559 Vitamin D deficiency, unspecified: Secondary | ICD-10-CM | POA: Diagnosis not present

## 2024-02-04 DIAGNOSIS — E782 Mixed hyperlipidemia: Secondary | ICD-10-CM | POA: Diagnosis not present

## 2024-02-04 DIAGNOSIS — F411 Generalized anxiety disorder: Secondary | ICD-10-CM

## 2024-02-04 DIAGNOSIS — F339 Major depressive disorder, recurrent, unspecified: Secondary | ICD-10-CM

## 2024-02-04 DIAGNOSIS — R7989 Other specified abnormal findings of blood chemistry: Secondary | ICD-10-CM

## 2024-02-04 MED ORDER — FLUOXETINE HCL 40 MG PO CAPS: 40.0000 mg | ORAL_CAPSULE | Freq: Every day | ORAL | 3 refills | Status: AC

## 2024-02-04 NOTE — Progress Notes (Signed)
 Subjective: CC: Follow-up weight, mood PCP: [name], DO ZOX:[name] Keith Ross is a 33 y.o. male presenting to clinic today for:  1.  Recurrent depression Patient has resumed use of Prozac  and Wellbutrin .  He does report some improvement in his eating behaviors and he has been trying to be intentional about what he is eating.  He has had a 14 pound weight loss but notes he still has binge eating behaviors where he is snacking past the point of fullness even though he does recognize himself as full.  He did not inquire with his insurance company as to which medications they would pay for for weight loss but will do so ASAP as he is interested in starting something   ROS: Per HPI  Allergies  Allergen Reactions   Latex Rash   Nickel Rash   Past Medical History:  Diagnosis Date   Allergy    Anxiety    Depression     Current Outpatient Medications:    buPROPion  (WELLBUTRIN  SR) 150 MG 12 hr tablet, Take 1 tablet (150 mg total) by mouth in the morning., Disp: 90 tablet, Rfl: 3   FLUoxetine  (PROZAC ) 40 MG capsule, TAKE 1 CAPSULE(40 MG) BY MOUTH DAILY, Disp: 90 capsule, Rfl: 1   fluticasone  (FLONASE ) 50 MCG/ACT nasal spray, Place 2 sprays into both nostrils daily., Disp: 16 g, Rfl: 6 Social History   Socioeconomic History   Marital status: Married    Spouse name: Bridgette Campus   Number of children: 1   Years of education: Not on file   Highest education level: Some college, no degree  Occupational History   Not on file  Tobacco Use   Smoking status: Every Day    Current packs/day: 1.00    Average packs/day: 1 pack/day for 10.0 years (10.0 ttl pk-yrs)    Types: Cigarettes   Smokeless tobacco: Never  Vaping Use   Vaping status: Never Used  Substance and Sexual Activity   Alcohol use: No    Comment: occ   Drug use: No   Sexual activity: Not on file  Other Topics Concern   Not on file  Social History Narrative   Not on file   Social Drivers of Health   Financial  Resource Strain: Low Risk  (01/31/2024)   Overall Financial Resource Strain (CARDIA)    Difficulty of Paying Living Expenses: Not very hard  Food Insecurity: No Food Insecurity (01/31/2024)   Hunger Vital Sign    Worried About Running Out of Food in the Last Year: Never true    Ran Out of Food in the Last Year: Never true  Transportation Needs: No Transportation Needs (01/31/2024)   PRAPARE - Administrator, Civil Service (Medical): No    Lack of Transportation (Non-Medical): No  Physical Activity: Inactive (01/31/2024)   Exercise Vital Sign    Days of Exercise per Week: 0 days    Minutes of Exercise per Session: Not on file  Stress: No Stress Concern Present (01/31/2024)   [employer] of Occupational Health - Occupational Stress Questionnaire    Feeling of Stress: Only a little  Social Connections: Socially Isolated (01/31/2024)   Social Connection and Isolation Panel    Frequency of Communication with Friends and Family: Never    Frequency of Social Gatherings with Friends and Family: Once a week    Attends Religious Services: Never    Database administrator or Organizations: No    Attends Engineer, structural: Not  on file    Marital Status: Married  Catering manager Violence: Not on file   Family History  Problem Relation Age of Onset   Depression Mother    Anxiety disorder Mother    Hypertension Father    Diabetes Maternal Grandmother    Hypertension Maternal Grandmother     Objective: Office vital signs reviewed. BP 128/85   Pulse 100   Temp 98.5 F (36.9 C)   Ht 6' (1.829 m)   Wt 264 lb (119.7 kg)   SpO2 96%   BMI 35.80 kg/m   Physical Examination:  General: Awake, alert, well nourished, No acute distress HEENT: sclera white, MMM Cardio: regular rate and rhythm, S1S2 heard, no murmurs appreciated Pulm: clear to auscultation bilaterally, no wheezes, rhonchi or rales; normal work of breathing on room air     02/04/2024    2:30 PM  02/04/2024    2:26 PM 09/01/2023    1:13 PM  Depression screen PHQ 2/9  Decreased Interest 1 0 1  Down, Depressed, Hopeless  0 0  PHQ - 2 Score 1 0 1  Altered sleeping  0 0  Tired, decreased energy 1 1 1   Change in appetite 3 1 3   Feeling bad or failure about yourself  0 0 0  Trouble concentrating 0 0 0  Moving slowly or fidgety/restless 0 0 0  Suicidal thoughts 0 0 1  PHQ-9 Score  2 6  Difficult doing work/chores Somewhat difficult Not difficult at all Not difficult at all      02/04/2024    2:30 PM 02/04/2024    2:26 PM 09/01/2023    1:12 PM 06/25/2023    3:45 PM  GAD 7 : Generalized Anxiety Score  Nervous, Anxious, on Edge 0 0 0 0  Control/stop worrying 0 0 0 0  Worry too much - different things 1 0 1 0  Trouble relaxing 0 0 0 0  Restless 1 0 1 0  Easily annoyed or irritable 1 0 2 0  Afraid - awful might happen 0 0 1 0  Total GAD 7 Score 3 0 5 0  Anxiety Difficulty Somewhat difficult Not difficult at all  Not difficult at all      Assessment/ Plan: 33 y.o. male   Recurrent depression (HCC) - Plan: FLUoxetine  (PROZAC ) 40 MG capsule  GAD (generalized anxiety disorder) - Plan: FLUoxetine  (PROZAC ) 40 MG capsule  Vitamin D  deficiency - Plan: VITAMIN D  25 Hydroxy (Vit-D Deficiency, Fractures)  Mixed hyperlipidemia - Plan: Lipid Panel  Elevated LFTs - Plan: Hepatic Function Panel  Elevated TSH - Plan: TSH + free T4  I would like him to have some repeat labs at some point.  I have renewed his Prozac .  He will follow-up with me in the next 3 to 4 months for weight recheck and we can do labs at that point.  He will inquire as to whether or not medications are covered for weight loss.  I am leaning towards Vyvanse for him given binge eating behaviors but I do think he would be appropriate for GLP as well.  He will contact me via MyChart ASAP and I will prescribe accordingly.   [name], DO [facility] [phone]

## 2024-02-09 ENCOUNTER — Encounter: Payer: Self-pay | Admitting: Family Medicine

## 2024-02-09 DIAGNOSIS — F50811 Binge eating disorder, moderate: Secondary | ICD-10-CM

## 2024-02-09 DIAGNOSIS — Z6837 Body mass index (BMI) 37.0-37.9, adult: Secondary | ICD-10-CM

## 2024-02-09 MED ORDER — LISDEXAMFETAMINE DIMESYLATE 30 MG PO CAPS: 30.0000 mg | ORAL_CAPSULE | Freq: Every day | ORAL | 0 refills | Status: DC

## 2024-02-09 NOTE — Telephone Encounter (Signed)
 Kelci, please call/ message pt with 1 month f/u on Vyvanse start for weight loss  Will need CSC/ UDS at that visit so needs to be in person.  Meds ordered this encounter  Medications   lisdexamfetamine (VYVANSE) 30 MG capsule    Sig: Take 1 capsule (30 mg total) by mouth daily.    Dispense:  30 capsule    Refill:  0

## 2024-02-23 MED ORDER — LISDEXAMFETAMINE DIMESYLATE 30 MG PO CAPS: 30.0000 mg | ORAL_CAPSULE | Freq: Every day | ORAL | 0 refills | Status: DC

## 2024-02-23 NOTE — Addendum Note (Signed)
 Addended by: JOLINDA NORENE HERO on: 02/23/2024 07:28 AM   Modules accepted: Orders

## 2024-03-05 ENCOUNTER — Ambulatory Visit: Admitting: Family Medicine

## 2024-03-05 NOTE — Progress Notes (Deleted)
 Subjective: CC:*** PCP: Jolinda Norene HERO, DO YEP:Keith Ross is a 33 y.o. male presenting to clinic today for:  1. ***   ROS: Per HPI  Allergies  Allergen Reactions   Latex Rash   Nickel Rash   Past Medical History:  Diagnosis Date   Allergy    Anxiety    Depression     Current Outpatient Medications:    buPROPion  (WELLBUTRIN  SR) 150 MG 12 hr tablet, Take 1 tablet (150 mg total) by mouth in the morning., Disp: 90 tablet, Rfl: 3   FLUoxetine  (PROZAC ) 40 MG capsule, Take 1 capsule (40 mg total) by mouth daily., Disp: 90 capsule, Rfl: 3   fluticasone  (FLONASE ) 50 MCG/ACT nasal spray, Place 2 sprays into both nostrils daily., Disp: 16 g, Rfl: 6   lisdexamfetamine (VYVANSE ) 30 MG capsule, Take 1 capsule (30 mg total) by mouth daily., Disp: 30 capsule, Rfl: 0 Social History   Socioeconomic History   Marital status: Married    Spouse name: Delon   Number of children: 1   Years of education: Not on file   Highest education level: Some college, no degree  Occupational History   Not on file  Tobacco Use   Smoking status: Every Day    Current packs/day: 1.00    Average packs/day: 1 pack/day for 10.0 years (10.0 ttl pk-yrs)    Types: Cigarettes   Smokeless tobacco: Never  Vaping Use   Vaping status: Never Used  Substance and Sexual Activity   Alcohol use: No    Comment: occ   Drug use: No   Sexual activity: Not on file  Other Topics Concern   Not on file  Social History Narrative   Not on file   Social Drivers of Health   Financial Resource Strain: Low Risk  (01/31/2024)   Overall Financial Resource Strain (CARDIA)    Difficulty of Paying Living Expenses: Not very hard  Food Insecurity: No Food Insecurity (01/31/2024)   Hunger Vital Sign    Worried About Running Out of Food in the Last Year: Never true    Ran Out of Food in the Last Year: Never true  Transportation Needs: No Transportation Needs (01/31/2024)   PRAPARE - Scientist, research (physical sciences) (Medical): No    Lack of Transportation (Non-Medical): No  Physical Activity: Inactive (01/31/2024)   Exercise Vital Sign    Days of Exercise per Week: 0 days    Minutes of Exercise per Session: Not on file  Stress: No Stress Concern Present (01/31/2024)   [employer] of Occupational Health - Occupational Stress Questionnaire    Feeling of Stress: Only a little  Social Connections: Socially Isolated (01/31/2024)   Social Connection and Isolation Panel    Frequency of Communication with Friends and Family: Never    Frequency of Social Gatherings with Friends and Family: Once a week    Attends Religious Services: Never    Database administrator or Organizations: No    Attends Engineer, structural: Not on file    Marital Status: Married  Catering manager Violence: Not on file   Family History  Problem Relation Age of Onset   Depression Mother    Anxiety disorder Mother    Hypertension Father    Diabetes Maternal Grandmother    Hypertension Maternal Grandmother     Objective: Office vital signs reviewed. There were no vitals taken for this visit.  Physical Examination:  General: Awake, alert, *** nourished, No  acute distress HEENT: Normal    Neck: No masses palpated. No lymphadenopathy    Ears: Tympanic membranes intact, normal light reflex, no erythema, no bulging    Eyes: PERRLA, extraocular membranes intact, sclera ***    Nose: nasal turbinates moist, *** nasal discharge    Throat: moist mucus membranes, no erythema, *** tonsillar exudate.  Airway is patent Cardio: regular rate and rhythm, S1S2 heard, no murmurs appreciated Pulm: clear to auscultation bilaterally, no wheezes, rhonchi or rales; normal work of breathing on room air GI: soft, non-tender, non-distended, bowel sounds present x4, no hepatomegaly, no splenomegaly, no masses GU: external vaginal tissue ***, cervix ***, *** punctate lesions on cervix appreciated, *** discharge from cervical  os, *** bleeding, *** cervical motion tenderness, *** abdominal/ adnexal masses Extremities: warm, well perfused, No edema, cyanosis or clubbing; +*** pulses bilaterally MSK: *** gait and *** station Skin: dry; intact; no rashes or lesions Neuro: *** Strength and light touch sensation grossly intact, *** DTRs ***/4  Assessment/ Plan: 33 y.o. male   Morbid obesity (HCC)  BMI 37.0-37.9, adult  Moderate binge-eating disorder  GAD (generalized anxiety disorder)  Controlled substance agreement signed  ***   Keith Parslow CHRISTELLA Fielding, DO [facility] [phone]

## 2024-04-06 ENCOUNTER — Ambulatory Visit (INDEPENDENT_AMBULATORY_CARE_PROVIDER_SITE_OTHER): Admitting: Family Medicine

## 2024-04-06 ENCOUNTER — Encounter: Payer: Self-pay | Admitting: Family Medicine

## 2024-04-06 DIAGNOSIS — F50811 Binge eating disorder, moderate: Secondary | ICD-10-CM

## 2024-04-06 DIAGNOSIS — E559 Vitamin D deficiency, unspecified: Secondary | ICD-10-CM

## 2024-04-06 DIAGNOSIS — R7989 Other specified abnormal findings of blood chemistry: Secondary | ICD-10-CM | POA: Diagnosis not present

## 2024-04-06 DIAGNOSIS — Z6837 Body mass index (BMI) 37.0-37.9, adult: Secondary | ICD-10-CM

## 2024-04-06 MED ORDER — LISDEXAMFETAMINE DIMESYLATE 40 MG PO CAPS: 40.0000 mg | ORAL_CAPSULE | ORAL | 0 refills | Status: DC

## 2024-04-06 NOTE — Patient Instructions (Signed)
 Have your weight and blood pressure ready for me for the virtual next month I'll text you at 12pm, please try and be on the chatroom by 12:10pm at the latest.

## 2024-04-06 NOTE — Progress Notes (Signed)
 Subjective: CC: Binge eating disorder PCP: Jolinda Norene HERO, DO YEP:Keith Ross is a 33 y.o. male presenting to clinic today for:  1.  Binge eating disorder, moderate Patient was started on Vyvanse  30mg .  He has tolerated the medication without much difficulty.  He does admit to some [place] but hydration helps with this.  He finds that works fairly good during the daytime but tends to wear off in the afternoons and he starts binge eating again.  He would like to try advancing dose to see if this might be more helpful.  Denies any heart palpitations, increase in anxiety or mood lability.   ROS: Per HPI  Allergies  Allergen Reactions   Latex Rash   Nickel Rash   Past Medical History:  Diagnosis Date   Allergy    Anxiety    Depression     Current Outpatient Medications:    buPROPion  (WELLBUTRIN  SR) 150 MG 12 hr tablet, Take 1 tablet (150 mg total) by mouth in the morning., Disp: 90 tablet, Rfl: 3   FLUoxetine  (PROZAC ) 40 MG capsule, Take 1 capsule (40 mg total) by mouth daily., Disp: 90 capsule, Rfl: 3   lisdexamfetamine (VYVANSE ) 30 MG capsule, Take 1 capsule (30 mg total) by mouth daily., Disp: 30 capsule, Rfl: 0   fluticasone  (FLONASE ) 50 MCG/ACT nasal spray, Place 2 sprays into both nostrils daily. (Patient not taking: Reported on 04/06/2024), Disp: 16 g, Rfl: 6 Social History   Socioeconomic History   Marital status: Married    Spouse name: Delon   Number of children: 1   Years of education: Not on file   Highest education level: Some college, no degree  Occupational History   Not on file  Tobacco Use   Smoking status: Every Day    Current packs/day: 1.00    Average packs/day: 1 pack/day for 10.0 years (10.0 ttl pk-yrs)    Types: Cigarettes   Smokeless tobacco: Never  Vaping Use   Vaping status: Never Used  Substance and Sexual Activity   Alcohol use: No    Comment: occ   Drug use: No   Sexual activity: Not on file  Other Topics Concern   Not on  file  Social History Narrative   Not on file   Social Drivers of Health   Financial Resource Strain: Low Risk  (01/31/2024)   Overall Financial Resource Strain (CARDIA)    Difficulty of Paying Living Expenses: Not very hard  Food Insecurity: No Food Insecurity (01/31/2024)   Hunger Vital Sign    Worried About Running Out of Food in the Last Year: Never true    Ran Out of Food in the Last Year: Never true  Transportation Needs: No Transportation Needs (01/31/2024)   PRAPARE - Administrator, Civil Service (Medical): No    Lack of Transportation (Non-Medical): No  Physical Activity: Inactive (01/31/2024)   Exercise Vital Sign    Days of Exercise per Week: 0 days    Minutes of Exercise per Session: Not on file  Stress: No Stress Concern Present (01/31/2024)   [employer] of Occupational Health - Occupational Stress Questionnaire    Feeling of Stress: Only a little  Social Connections: Socially Isolated (01/31/2024)   Social Connection and Isolation Panel    Frequency of Communication with Friends and Family: Never    Frequency of Social Gatherings with Friends and Family: Once a week    Attends Religious Services: Never    Production manager of [employer]  or Organizations: No    Attends Engineer, structural: Not on file    Marital Status: Married  Catering manager Violence: Not on file   Family History  Problem Relation Age of Onset   Depression Mother    Anxiety disorder Mother    Hypertension Father    Diabetes Maternal Grandmother    Hypertension Maternal Grandmother     Objective: Office vital signs reviewed. BP 126/87   Pulse 85   Temp 98.1 F (36.7 C)   Ht 6' (1.829 m)   Wt 260 lb 2 oz (118 kg)   SpO2 97%   BMI 35.28 kg/m   Physical Examination:  General: Awake, alert, well nourished, obese male. No acute distress HEENT: Sclera white.  Moist mucous membranes.  No oropharyngeal lesions Cardio: regular rate and rhythm, S1S2 heard, no murmurs  appreciated Pulm: clear to auscultation bilaterally, no wheezes, rhonchi or rales; normal work of breathing on room air     04/06/2024    2:25 PM 02/04/2024    2:30 PM 02/04/2024    2:26 PM  Depression screen PHQ 2/9  Decreased Interest 1 1 0  Down, Depressed, Hopeless 1  0  PHQ - 2 Score 2 1 0  Altered sleeping 0  0  Tired, decreased energy 1 1 1   Change in appetite 1 3 1   Feeling bad or failure about yourself  0 0 0  Trouble concentrating 0 0 0  Moving slowly or fidgety/restless 0 0 0  Suicidal thoughts 0 0 0  PHQ-9 Score 4  2  Difficult doing work/chores Somewhat difficult Somewhat difficult Not difficult at all      04/06/2024    2:28 PM 02/04/2024    2:30 PM 02/04/2024    2:26 PM 09/01/2023    1:12 PM  GAD 7 : Generalized Anxiety Score  Nervous, Anxious, on Edge 1 0 0 0  Control/stop worrying 0 0 0 0  Worry too much - different things 0 1 0 1  Trouble relaxing 0 0 0 0  Restless 0 1 0 1  Easily annoyed or irritable 2 1 0 2  Afraid - awful might happen 0 0 0 1  Total GAD 7 Score 3 3 0 5  Anxiety Difficulty Somewhat difficult Somewhat difficult Not difficult at all     Assessment/ Plan: 33 y.o. male   Morbid obesity (HCC) - Plan: CMP14+EGFR, Lipid Panel, ToxASSURE Select 13 (MW), Urine, lisdexamfetamine (VYVANSE ) 40 MG capsule  BMI 37.0-37.9, adult - Plan: CMP14+EGFR, Lipid Panel, ToxASSURE Select 13 (MW), Urine, lisdexamfetamine (VYVANSE ) 40 MG capsule  Moderate binge-eating disorder - Plan: CMP14+EGFR, Lipid Panel, ToxASSURE Select 13 (MW), Urine, lisdexamfetamine (VYVANSE ) 40 MG capsule  Abnormal TSH - Plan: CMP14+EGFR, TSH + free T4, Lipid Panel  Vitamin D  deficiency - Plan: VITAMIN D  25 Hydroxy (Vit-D Deficiency, Fractures), CMP14+EGFR  Nonfasting labs collected today.  UDS and CSA are up-to-date as per office policy.  He had a 4 pound weight loss so far.  Go ahead and advance dose to 40 mg if tolerated.  Will schedule virtual visit for 1 month  follow-up.   Norene CHRISTELLA Fielding, DO [facility] [phone]

## 2024-04-07 ENCOUNTER — Ambulatory Visit: Payer: Self-pay | Admitting: Family Medicine

## 2024-04-07 LAB — VITAMIN D 25 HYDROXY (VIT D DEFICIENCY, FRACTURES): Vit D, 25-Hydroxy: 24 ng/mL — ABNORMAL LOW (ref 30.0–100.0)

## 2024-04-07 LAB — TSH+FREE T4
Free T4: 1.12 ng/dL (ref 0.82–1.77)
TSH: 3.79 u[IU]/mL (ref 0.450–4.500)

## 2024-04-08 ENCOUNTER — Encounter: Payer: Self-pay | Admitting: Family Medicine

## 2024-04-08 LAB — TOXASSURE SELECT 13 (MW), URINE

## 2024-04-09 ENCOUNTER — Other Ambulatory Visit: Payer: Self-pay | Admitting: Family Medicine

## 2024-04-09 DIAGNOSIS — Z6837 Body mass index (BMI) 37.0-37.9, adult: Secondary | ICD-10-CM

## 2024-04-09 DIAGNOSIS — F50811 Binge eating disorder, moderate: Secondary | ICD-10-CM

## 2024-04-09 MED ORDER — LISDEXAMFETAMINE DIMESYLATE 40 MG PO CAPS: 40.0000 mg | ORAL_CAPSULE | ORAL | 0 refills | Status: DC

## 2024-05-04 ENCOUNTER — Telehealth (INDEPENDENT_AMBULATORY_CARE_PROVIDER_SITE_OTHER): Admitting: Family Medicine

## 2024-05-04 ENCOUNTER — Encounter: Payer: Self-pay | Admitting: Family Medicine

## 2024-05-04 VITALS — Wt 256.8 lb

## 2024-05-04 DIAGNOSIS — F50811 Binge eating disorder, moderate: Secondary | ICD-10-CM

## 2024-05-04 DIAGNOSIS — Z6834 Body mass index (BMI) 34.0-34.9, adult: Secondary | ICD-10-CM

## 2024-05-04 MED ORDER — LISDEXAMFETAMINE DIMESYLATE 40 MG PO CAPS: 40.0000 mg | ORAL_CAPSULE | ORAL | 0 refills | Status: DC

## 2024-05-04 NOTE — Progress Notes (Signed)
 MyChart Video visit  Subjective: RR:apwhz eating PCP: Jolinda Norene HERO, DO YEP:Izcpw L Cullinane is a 33 y.o. male. Patient provides verbal consent for consult held via video.  Due to COVID-19 pandemic this visit was conducted virtually. This visit type was conducted due to national recommendations for restrictions regarding the COVID-19 Pandemic (e.g. social distancing, sheltering in place) in an effort to limit this patient's exposure and mitigate transmission in our community. All issues noted in this document were discussed and addressed.  A physical exam was not performed with this format.   Location of patient: car Location of provider: [facility] Others present for call: none  1. Binge eating Reports feeling better on new dose of Vyvanse , which was increased to 40mg  last OV.  He reports improvement in cravings/ suppression of appetite. Energy and focus is better.  He feels like mood has even improved because he is able to do the things he used to enjoy.  Had slight difficulty with sleep 1 night but he took the medication later than usual so that has since resolved after going back to early AM dosing.    ROS: Per HPI  Allergies  Allergen Reactions   Latex Rash   Nickel Rash   Past Medical History:  Diagnosis Date   Allergy    Anxiety    Depression     Current Outpatient Medications:    buPROPion  (WELLBUTRIN  SR) 150 MG 12 hr tablet, Take 1 tablet (150 mg total) by mouth in the morning., Disp: 90 tablet, Rfl: 3   FLUoxetine  (PROZAC ) 40 MG capsule, Take 1 capsule (40 mg total) by mouth daily., Disp: 90 capsule, Rfl: 3   fluticasone  (FLONASE ) 50 MCG/ACT nasal spray, Place 2 sprays into both nostrils daily. (Patient not taking: Reported on 04/06/2024), Disp: 16 g, Rfl: 6   lisdexamfetamine (VYVANSE ) 40 MG capsule, Take 1 capsule (40 mg total) by mouth every morning., Disp: 30 capsule, Rfl: 0     05/04/2024   12:19 PM 04/06/2024    2:22 PM 02/04/2024    2:26 PM  Vitals with BMI   Height  6' 0 6' 0  Weight 256 lbs 13 oz 260 lbs 2 oz 264 lbs  BMI 34.82 35.27 35.8  Systolic  126 128  Diastolic  87 85  Pulse  85 100   Gen: well appearing obese male, NAD Psych: mood stable, speech normal affect appropriate.  Thought process linear     05/04/2024   12:18 PM 04/06/2024    2:25 PM 02/04/2024    2:30 PM  Depression screen PHQ 2/9  Decreased Interest 0 1 1  Down, Depressed, Hopeless 0 1   PHQ - 2 Score 0 2 1  Altered sleeping  0   Tired, decreased energy  1 1  Change in appetite  1 3  Feeling bad or failure about yourself   0 0  Trouble concentrating  0 0  Moving slowly or fidgety/restless  0 0  Suicidal thoughts  0 0  PHQ-9 Score  4   Difficult doing work/chores  Somewhat difficult Somewhat difficult     Assessment/ Plan: 33 y.o. male   Moderate binge-eating disorder - Plan: lisdexamfetamine (VYVANSE ) 40 MG capsule, lisdexamfetamine (VYVANSE ) 40 MG capsule, lisdexamfetamine (VYVANSE ) 40 MG capsule  BMI 34.0-34.9,adult - Plan: lisdexamfetamine (VYVANSE ) 40 MG capsule, lisdexamfetamine (VYVANSE ) 40 MG capsule, lisdexamfetamine (VYVANSE ) 40 MG capsule  Doing well with increased dose of Vyvanse .  BMI down from 37 to 34.8 now.  No unwanted side effects.  Mental health improving with med as well.  3 m virtual follow up scheduled. Patient aware of date and time.  The Narcotic Database has been reviewed.  There were no red flags.  UTD on UDS and CSC.   Start time: 12:16pm End time: 12:24pm  Total time spent on patient care (including video visit/ documentation): 10 minutes  Alexandros Ewan CHRISTELLA Fielding, DO [facility] [phone]

## 2024-07-30 ENCOUNTER — Encounter: Payer: Self-pay | Admitting: Family Medicine

## 2024-07-30 ENCOUNTER — Telehealth: Payer: Self-pay | Admitting: Family Medicine

## 2024-07-30 DIAGNOSIS — Z6834 Body mass index (BMI) 34.0-34.9, adult: Secondary | ICD-10-CM

## 2024-07-30 DIAGNOSIS — F50811 Binge eating disorder, moderate: Secondary | ICD-10-CM

## 2024-07-30 MED ORDER — LISDEXAMFETAMINE DIMESYLATE 50 MG PO CAPS: 50.0000 mg | ORAL_CAPSULE | ORAL | 0 refills | Status: AC

## 2024-07-30 NOTE — Progress Notes (Signed)
 MyChart Video visit  Subjective: CC:f/u binge eating disorder PCP: Jolinda Norene HERO, DO Keith Ross:Izcpw L Brau is a 33 y.o. male. Patient provides verbal consent for consult held via video.  Due to COVID-19 pandemic this visit was conducted virtually. This visit type was conducted due to national recommendations for restrictions regarding the COVID-19 Pandemic (e.g. social distancing, sheltering in place) in an effort to limit this patient's exposure and mitigate transmission in our community. All issues noted in this document were discussed and addressed.  A physical exam was not performed with this format.   Location of patient: work Location of provider: [facility] Others present for call: none  1. Binge eating disorder Patient reports he is feeling well.  He has no complaints.  Compliant with meds. Feeling like the Vyvanse  is wearing off by suppertime, taking med around 6-7am.  He is still not binging during the day and is hydrating well. He went 1.5 weeks off of med due to backorder.  ROS: Per HPI  Allergies[1] Past Medical History:  Diagnosis Date   Allergy    Anxiety    Depression    Current Medications[2]  Height 6' (1.829 m), weight 262 lb 1.6 oz (118.9 kg). Gen: well appearing male NAD Psych: Mood stable, speech normal, affect appropriate.  Good eye contact.  Thought process linear  Assessment/ Plan: 33 y.o. male   BMI 34.0-34.9,adult - Plan: lisdexamfetamine (VYVANSE ) 50 MG capsule, lisdexamfetamine (VYVANSE ) 50 MG capsule, lisdexamfetamine (VYVANSE ) 50 MG capsule  Moderate binge-eating disorder - Plan: lisdexamfetamine (VYVANSE ) 50 MG capsule, lisdexamfetamine (VYVANSE ) 50 MG capsule, lisdexamfetamine (VYVANSE ) 50 MG capsule  I think that he has had a little weight gain due to both the lapse in medication and probably some tolerance building to the 40 mg.  Advance to 50 mg and we will reassess again virtually in 3 months.  We discussed red flag signs and symptoms and  reasons for reevaluation.  The national narcotic database was reviewed and there were no red flags.  He is up-to-date on UDS and CSC  Start time: 1:02pm End time: 1:08pm  Total time spent on patient care (including video visit/ documentation): 10 minutes  Kee Drudge M Kharis Lapenna, DO [facility] [phone]      [1]  Allergies Allergen Reactions   Latex Rash   Nickel Rash  [2]  Current Outpatient Medications:    buPROPion  (WELLBUTRIN  SR) 150 MG 12 hr tablet, Take 1 tablet (150 mg total) by mouth in the morning., Disp: 90 tablet, Rfl: 3   FLUoxetine  (PROZAC ) 40 MG capsule, Take 1 capsule (40 mg total) by mouth daily., Disp: 90 capsule, Rfl: 3   fluticasone  (FLONASE ) 50 MCG/ACT nasal spray, Place 2 sprays into both nostrils daily. (Patient not taking: Reported on 04/06/2024), Disp: 16 g, Rfl: 6   lisdexamfetamine (VYVANSE ) 50 MG capsule, Take 1 capsule (50 mg total) by mouth every morning., Disp: 30 capsule, Rfl: 0   [START ON 08/30/2024] lisdexamfetamine (VYVANSE ) 50 MG capsule, Take 1 capsule (50 mg total) by mouth every morning., Disp: 30 capsule, Rfl: 0   [START ON 09/28/2024] lisdexamfetamine (VYVANSE ) 50 MG capsule, Take 1 capsule (50 mg total) by mouth every morning., Disp: 30 capsule, Rfl: 0

## 2024-10-19 ENCOUNTER — Ambulatory Visit: Admitting: Family Medicine

## 2025-02-09 ENCOUNTER — Encounter: Payer: Self-pay | Admitting: Family Medicine
